# Patient Record
Sex: Female | Born: 1970 | Race: White | Hispanic: No | Marital: Married | Smoking: Never smoker
Health system: Southern US, Community
[De-identification: ages and names within clinical notes are randomized; demographics above are authoritative.]

## PROBLEM LIST (undated history)

## (undated) DIAGNOSIS — J45909 Unspecified asthma, uncomplicated: Secondary | ICD-10-CM

## (undated) DIAGNOSIS — K5900 Constipation, unspecified: Secondary | ICD-10-CM

## (undated) DIAGNOSIS — R112 Nausea with vomiting, unspecified: Secondary | ICD-10-CM

## (undated) DIAGNOSIS — E282 Polycystic ovarian syndrome: Secondary | ICD-10-CM

## (undated) DIAGNOSIS — K219 Gastro-esophageal reflux disease without esophagitis: Secondary | ICD-10-CM

## (undated) DIAGNOSIS — J302 Other seasonal allergic rhinitis: Secondary | ICD-10-CM

## (undated) DIAGNOSIS — D649 Anemia, unspecified: Secondary | ICD-10-CM

## (undated) DIAGNOSIS — J40 Bronchitis, not specified as acute or chronic: Secondary | ICD-10-CM

## (undated) DIAGNOSIS — Z9889 Other specified postprocedural states: Secondary | ICD-10-CM

## (undated) HISTORY — DX: Unspecified asthma, uncomplicated: J45.909

## (undated) HISTORY — DX: Constipation, unspecified: K59.00

## (undated) HISTORY — PX: CHOLECYSTECTOMY: SHX55

## (undated) HISTORY — PX: TUBAL LIGATION: SHX77

## (undated) HISTORY — DX: Polycystic ovarian syndrome: E28.2

## (undated) HISTORY — PX: OTHER SURGICAL HISTORY: SHX169

## (undated) HISTORY — PX: WISDOM TOOTH EXTRACTION: SHX21

## (undated) HISTORY — DX: Anemia, unspecified: D64.9

---

## 1998-10-29 ENCOUNTER — Other Ambulatory Visit: Admission: RE | Admit: 1998-10-29 | Discharge: 1998-10-29 | Payer: Self-pay | Admitting: Obstetrics and Gynecology

## 1999-04-27 ENCOUNTER — Inpatient Hospital Stay (HOSPITAL_COMMUNITY): Admission: AD | Admit: 1999-04-27 | Discharge: 1999-04-27 | Payer: Self-pay | Admitting: Obstetrics and Gynecology

## 1999-04-29 ENCOUNTER — Inpatient Hospital Stay (HOSPITAL_COMMUNITY): Admission: AD | Admit: 1999-04-29 | Discharge: 1999-04-29 | Payer: Self-pay | Admitting: Obstetrics and Gynecology

## 1999-05-03 ENCOUNTER — Inpatient Hospital Stay (HOSPITAL_COMMUNITY): Admission: AD | Admit: 1999-05-03 | Discharge: 1999-05-06 | Payer: Self-pay | Admitting: Obstetrics and Gynecology

## 1999-08-15 ENCOUNTER — Encounter: Admission: RE | Admit: 1999-08-15 | Discharge: 1999-11-13 | Payer: Self-pay | Admitting: Obstetrics and Gynecology

## 2000-02-17 ENCOUNTER — Encounter: Payer: Self-pay | Admitting: Obstetrics and Gynecology

## 2000-02-17 ENCOUNTER — Encounter: Admission: RE | Admit: 2000-02-17 | Discharge: 2000-02-17 | Payer: Self-pay | Admitting: Obstetrics and Gynecology

## 2001-06-21 ENCOUNTER — Other Ambulatory Visit: Admission: RE | Admit: 2001-06-21 | Discharge: 2001-06-21 | Payer: Self-pay | Admitting: Obstetrics and Gynecology

## 2002-10-15 ENCOUNTER — Other Ambulatory Visit: Admission: RE | Admit: 2002-10-15 | Discharge: 2002-10-15 | Payer: Self-pay | Admitting: Obstetrics and Gynecology

## 2003-01-02 ENCOUNTER — Ambulatory Visit (HOSPITAL_COMMUNITY): Admission: RE | Admit: 2003-01-02 | Discharge: 2003-01-02 | Payer: Self-pay | Admitting: Obstetrics and Gynecology

## 2003-03-14 HISTORY — PX: LAPAROSCOPIC ENDOMETRIOSIS FULGURATION: SUR769

## 2005-03-09 ENCOUNTER — Other Ambulatory Visit: Admission: RE | Admit: 2005-03-09 | Discharge: 2005-03-09 | Payer: Self-pay | Admitting: Obstetrics and Gynecology

## 2005-04-01 ENCOUNTER — Inpatient Hospital Stay (HOSPITAL_COMMUNITY): Admission: AD | Admit: 2005-04-01 | Discharge: 2005-04-01 | Payer: Self-pay | Admitting: Obstetrics and Gynecology

## 2005-08-31 ENCOUNTER — Inpatient Hospital Stay (HOSPITAL_COMMUNITY): Admission: AD | Admit: 2005-08-31 | Discharge: 2005-08-31 | Payer: Self-pay | Admitting: Obstetrics and Gynecology

## 2005-09-21 ENCOUNTER — Inpatient Hospital Stay (HOSPITAL_COMMUNITY): Admission: AD | Admit: 2005-09-21 | Discharge: 2005-09-24 | Payer: Self-pay | Admitting: Obstetrics and Gynecology

## 2005-09-25 ENCOUNTER — Encounter: Admission: RE | Admit: 2005-09-25 | Discharge: 2005-10-23 | Payer: Self-pay | Admitting: Obstetrics and Gynecology

## 2005-10-05 ENCOUNTER — Inpatient Hospital Stay (HOSPITAL_COMMUNITY): Admission: AD | Admit: 2005-10-05 | Discharge: 2005-10-05 | Payer: Self-pay | Admitting: Obstetrics and Gynecology

## 2011-12-19 ENCOUNTER — Ambulatory Visit: Payer: Self-pay | Admitting: Obstetrics and Gynecology

## 2012-02-22 ENCOUNTER — Encounter: Payer: Self-pay | Admitting: Obstetrics and Gynecology

## 2012-02-22 ENCOUNTER — Ambulatory Visit (INDEPENDENT_AMBULATORY_CARE_PROVIDER_SITE_OTHER): Payer: BC Managed Care – PPO | Admitting: Obstetrics and Gynecology

## 2012-02-22 VITALS — BP 108/70 | Ht 65.0 in | Wt 224.0 lb

## 2012-02-22 DIAGNOSIS — Z01419 Encounter for gynecological examination (general) (routine) without abnormal findings: Secondary | ICD-10-CM

## 2012-02-22 DIAGNOSIS — Z124 Encounter for screening for malignant neoplasm of cervix: Secondary | ICD-10-CM

## 2012-02-22 NOTE — Progress Notes (Signed)
ANNUAL GYNECOLOGIC EXAMINATION   Courtney Mcintyre is a 41 y.o. female, No obstetric history on file., who presents for an annual exam. The patient has a history of recurrent ASCUS Pap smears. HPV is negative.   History   Social History  . Marital Status: Married    Spouse Name: N/A    Number of Children: N/A  . Years of Education: N/A   Social History Main Topics  . Smoking status: Never Smoker   . Smokeless tobacco: Never Used  . Alcohol Use: No  . Drug Use: No  . Sexually Active: Yes -- Female partner(s)    Birth Control/ Protection: Surgical   Other Topics Concern  . None   Social History Narrative  . None    Menstrual cycle:   LMP: Patient's last menstrual period was 02/08/2012.             The following portions of the patient's history were reviewed and updated as appropriate: allergies, current medications, past family history, past medical history, past social history, past surgical history and problem list.  Review of Systems Pertinent items are noted in HPI. Breast:Negative for breast lump,nipple discharge or nipple retraction Gastrointestinal: Negative for abdominal pain, change in bowel habits or rectal bleeding Urinary:negative   Objective:    BP 108/70  Wt 224 lb (101.606 kg)  LMP 02/08/2012    Weight:  Wt Readings from Last 1 Encounters:  02/22/12 224 lb (101.606 kg)          BMI: There is no height on file to calculate BMI.  General Appearance: Alert, appropriate appearance for age. No acute distress HEENT: Grossly normal Neck / Thyroid: Supple, no masses, nodes or enlargement Lungs: clear to auscultation bilaterally Back: No CVA tenderness Breast Exam: No masses or nodes.No dimpling, nipple retraction or discharge. Cardiovascular: Regular rate and rhythm. S1, S2, no murmur Gastrointestinal: Soft, non-tender, no masses or organomegaly  ++++++++++++++++++++++++++++++++++++++++++++++++++++++++  Pelvic Exam: External genitalia: normal general  appearance Vaginal: normal without tenderness, induration or masses. Relaxation: Yes Cervix: normal appearance Adnexa: normal bimanual exam Uterus: normal size, shape, and consistency Rectovaginal: normal rectal, no masses  ++++++++++++++++++++++++++++++++++++++++++++++++++++++++  Lymphatic Exam: Non-palpable nodes in neck, clavicular, axillary, or inguinal regions Neurologic: Normal speech, no tremor  Psychiatric: Alert and oriented, appropriate affect.  Assessment:    Normal gyn exam   Overweight or obese: Yes   Pelvic relaxation: Yes  History of recurrent ASCUS Pap smear. HPV negative.   Plan:    mammogram pap smear return annually or prn Contraception:bilateral tubal ligation    Medications prescribed: none  STD screen request: No   The updated Pap smear screening guidelines were discussed with the patient. The patient requested that I obtain a Pap smear: Yes.  Kegel exercises discussed: Yes.  I have recommended that we repeat Pap smear only at this point. If the patient wants to try something to help her Pap smear, then we may try Boric Acid suppositories or Vagifem.  Proper diet and regular exercise were reviewed.  Annual mammograms recommended starting at age 70. Proper breast care was discussed.  Screening colonoscopy is recommended beginning at age 29.  Regular health maintenance was reviewed.  Sleep hygiene was discussed.  Adequate calcium and vitamin D intake was emphasized.  Leonard Schwartz M.D.    Regular Periods: yes Mammogram: 01/04/2011 "WNL"  Monthly Breast Ex.: no Exercise: yes "Trying"  Tetanus < 10 years: no Seatbelts: yes  NI. Bladder Functn.: yes Abuse at home: no  Daily  BM's: yes Stressful Work: no  Healthy Diet: yes Sigmoid-Colonoscopy: N/A  Calcium: no Medical problems this year: N/A   LAST PAP:12/13/2010 ASC-US    Contraception: BTL  Mammogram:  2012   PCP: Dr.Gates   PMH: No Changes   FMH: Father had open  heart surgery 2 summers ago.   Last Bone Scan: N/A

## 2012-02-26 LAB — PAP IG W/ RFLX HPV ASCU

## 2012-10-14 ENCOUNTER — Other Ambulatory Visit: Payer: Self-pay | Admitting: Obstetrics and Gynecology

## 2014-07-09 ENCOUNTER — Telehealth: Payer: Self-pay | Admitting: Hematology and Oncology

## 2014-07-09 NOTE — Telephone Encounter (Signed)
new patient appt-s/w patient and gave np appt for 05/20 @ 12:30 w/Dr. Pamelia HoitGudena Referring Dr. Shaune Pollackonna Gates Dx-Leukocytosis

## 2014-07-09 NOTE — Telephone Encounter (Signed)
new patient referral-left message for patient to return call to schedule appt °

## 2014-07-16 ENCOUNTER — Other Ambulatory Visit: Payer: Self-pay | Admitting: Obstetrics and Gynecology

## 2014-07-31 ENCOUNTER — Ambulatory Visit (HOSPITAL_BASED_OUTPATIENT_CLINIC_OR_DEPARTMENT_OTHER): Payer: BLUE CROSS/BLUE SHIELD | Admitting: Hematology and Oncology

## 2014-07-31 ENCOUNTER — Ambulatory Visit: Payer: BLUE CROSS/BLUE SHIELD

## 2014-07-31 ENCOUNTER — Encounter: Payer: Self-pay | Admitting: Hematology and Oncology

## 2014-07-31 ENCOUNTER — Telehealth: Payer: Self-pay | Admitting: Hematology and Oncology

## 2014-07-31 VITALS — BP 115/55 | HR 69 | Temp 97.1°F | Resp 18 | Ht 65.0 in | Wt 215.8 lb

## 2014-07-31 DIAGNOSIS — D72829 Elevated white blood cell count, unspecified: Secondary | ICD-10-CM

## 2014-07-31 DIAGNOSIS — N920 Excessive and frequent menstruation with regular cycle: Secondary | ICD-10-CM | POA: Diagnosis not present

## 2014-07-31 LAB — CBC WITH DIFFERENTIAL/PLATELET
BASO%: 0.9 % (ref 0.0–2.0)
BASOS ABS: 0.1 10*3/uL (ref 0.0–0.1)
EOS%: 2.9 % (ref 0.0–7.0)
Eosinophils Absolute: 0.4 10*3/uL (ref 0.0–0.5)
HEMATOCRIT: 38 % (ref 34.8–46.6)
HGB: 12 g/dL (ref 11.6–15.9)
LYMPH%: 20.8 % (ref 14.0–49.7)
MCH: 24.4 pg — AB (ref 25.1–34.0)
MCHC: 31.5 g/dL (ref 31.5–36.0)
MCV: 77.4 fL — AB (ref 79.5–101.0)
MONO#: 0.8 10*3/uL (ref 0.1–0.9)
MONO%: 6.2 % (ref 0.0–14.0)
NEUT#: 9.2 10*3/uL — ABNORMAL HIGH (ref 1.5–6.5)
NEUT%: 69.2 % (ref 38.4–76.8)
Platelets: 353 10*3/uL (ref 145–400)
RBC: 4.92 10*6/uL (ref 3.70–5.45)
RDW: 24 % — ABNORMAL HIGH (ref 11.2–14.5)
WBC: 13.3 10*3/uL — ABNORMAL HIGH (ref 3.9–10.3)
lymph#: 2.8 10*3/uL (ref 0.9–3.3)

## 2014-07-31 LAB — C-REACTIVE PROTEIN: CRP: 1.2 mg/dL — ABNORMAL HIGH (ref <0.60)

## 2014-07-31 NOTE — Progress Notes (Signed)
[facility] NOTE  Patient Care Team: Darcus Austin, MD as PCP - General (Family Medicine)  CHIEF COMPLAINTS/PURPOSE OF CONSULTATION:  Leukocytosis  HISTORY OF PRESENTING ILLNESS:  Courtney Mcintyre 44 y.o. female is here because of chronic leukocytosis. Patient recently has been undergoing a lot of problems with heavy menstrual bleeding along with heavy intermenstrual bleeding. She was diagnosed with endometriosis and thickening of the lining of the uterus. She is scheduled to undergo hysterectomy 08/18/2014. She was noted on a regular CBC to have elevated white blood cell count of 13,000 and 12,000 respectively in March and April. According to her she apparently had elevated white blood cell count even a year ago and this led to the concern for hematology evaluation. Patient is otherwise healthy without any other chronic medical problems. She does have history of polycystic ovarian disease.  I reviewed her records extensively and collaborated the history with the patient.  MEDICAL HISTORY:  Past Medical History  Diagnosis Date  . Anemia     SURGICAL HISTORY: Past Surgical History  Procedure Laterality Date  . Tubal ligation    . Cholecystectomy    . Cesarean section  2001, 2007  . Laparoscopic endometriosis fulguration  2005  . Cystoscopy      SOCIAL HISTORY: History   Social History  . Marital Status: Married    Spouse Name: N/A  . Number of Children: N/A  . Years of Education: N/A   Occupational History  . Not on file.   Social History Main Topics  . Smoking status: Never Smoker   . Smokeless tobacco: Never Used  . Alcohol Use: No  . Drug Use: No  . Sexual Activity:    Partners: Male    Birth Control/ Protection: Surgical   Other Topics Concern  . Not on file   Social History Narrative    FAMILY HISTORY: Family History  Problem Relation Age of Onset  . Heart disease Father   . Hypercholesterolemia Father   . Endometriosis Sister    . Endometriosis Sister     ALLERGIES:  is allergic to codeine; compazine; and zofran.  MEDICATIONS:  Current Outpatient Prescriptions  Medication Sig Dispense Refill  . cetirizine (ZYRTEC) 10 MG tablet Take 10 mg by mouth daily.    . fluticasone (FLONASE) 50 MCG/ACT nasal spray Place 1 spray into both nostrils daily.    Marland Kitchen ibuprofen (ADVIL,MOTRIN) 800 MG tablet 800 mg 3 (three) times daily.     Lenda Kelp FE 1/20 1-20 MG-MCG tablet Take 1 tablet by mouth daily.   0  . medroxyPROGESTERone (PROVERA) 10 MG tablet   1  . montelukast (SINGULAIR) 10 MG tablet     . PRESCRIPTION MEDICATION Take 2 tablets by mouth 2 (two) times daily.    . ranitidine (ZANTAC) 150 MG tablet Take 150 mg by mouth 2 (two) times daily.    . vitamin B-12 (CYANOCOBALAMIN) 100 MCG tablet Take 100 mcg by mouth daily.     No current facility-administered medications for this visit.    REVIEW OF SYSTEMS:   Constitutional: Denies fevers, chills or abnormal night sweats Eyes: Denies blurriness of vision, double vision or watery eyes Ears, nose, mouth, throat, and face: Denies mucositis or sore throat Respiratory: Denies cough, dyspnea or wheezes Cardiovascular: Denies palpitation, chest discomfort or lower extremity swelling Gastrointestinal:  Denies nausea, heartburn or change in bowel habits Skin: Denies abnormal skin rashes Lymphatics: Denies new lymphadenopathy or easy bruising Neurological:Denies numbness, tingling or new weaknesses Behavioral/Psych:  Mood is stable, no new changes  GYN: heavy menstrual bleeding and intermenstrual bleeding due to endometriosis and endometrial hyperplasia All other systems were reviewed with the patient and are negative.  PHYSICAL EXAMINATION: ECOG PERFORMANCE STATUS: 1 - Symptomatic but completely ambulatory  Filed Vitals:   07/31/14 1224  BP: 115/55  Pulse: 69  Temp: 97.1 F (36.2 C)  Resp: 18   Filed Weights   07/31/14 1224  Weight: 215 lb 12.8 oz (97.886 kg)     GENERAL:alert, no distress and comfortable SKIN: skin color, texture, turgor are normal, no rashes or significant lesions EYES: normal, conjunctiva are pink and non-injected, sclera clear OROPHARYNX:no exudate, no erythema and lips, buccal mucosa, and tongue normal  NECK: supple, thyroid normal size, non-tender, without nodularity LYMPH:  no palpable lymphadenopathy in the cervical, axillary or inguinal LUNGS: clear to auscultation and percussion with normal breathing effort HEART: regular rate & rhythm and no murmurs and no lower extremity edema ABDOMEN:abdomen soft, non-tender and normal bowel sounds Musculoskeletal:no cyanosis of digits and no clubbing  PSYCH: alert & oriented x 3 with fluent speech NEURO: no focal motor/sensory deficits  LABORATORY DATA:  I have reviewed the data as listed Lab Results  Component Value Date   WBC 13.3* 07/31/2014   HGB 12.0 07/31/2014   HCT 38.0 07/31/2014   MCV 77.4* 07/31/2014   PLT 353 07/31/2014   No results found for: NA, K, CL, CO2   ASSESSMENT AND PLAN:  1. Leukocytosis:  I discussed with the patient entire pathophysiology of white blood cells including the differential of white blood cells. I discussed with her that the first step in evaluating her would be to obtain a differential on the white blood cells to find out if the elevation of the white blood cells is related to neutrophils and lymphocytes and eosinophils are monocytes or basophils.   2. I would also like to test for C-reactive protein to evaluate if she has underlying inflammation.  (3. I discussed the entire differential diagnosis depends on the type of blood cell abnormality. If it is neutrophils then we would consider infections and formations as the most likely etiologies. If it is lymphocytosis then certainly monoclonal B-cell lymphocytosis/CLL/lymphoma could be in the differential. If it is eosinophils then allergy conditions including drug hypersensitivity could be  in the differential, if it is basophils than CML could be in the differential.)  Result review: 1.  CBC with Diff revealed Leukocytosis is being caused by increased Neutrophills. I strongly believe it is related to underlying chronic inflammation (either from uterus or sinuses or asthma) 2. We do not need to do a bone marrow biopsy 3. Follow up in 3 months with another CBCdiff  Menorrhagia: Getting Hysterectomy next month. Thrombocytosis: Platelets normalized. They are an acute phase reactant secondary to iron def or inflammation.  All questions were answered. The patient knows to call the clinic with any problems, questions or concerns.    Rulon Eisenmenger, MD 2:29 PM

## 2014-07-31 NOTE — Progress Notes (Signed)
Checked in new pt with no financial concerns. °

## 2014-07-31 NOTE — Telephone Encounter (Signed)
Gave avs & calendar for August °

## 2014-08-13 NOTE — Patient Instructions (Addendum)
   Your procedure is scheduled on:  Thursday, June 9  [facility] at: 10:30 AM Pick up the phone at the desk and dial [phone] and inform us of your arrival.  Please call this number if you have any problems the morning of surgery: [phone]  Remember: Do not eat food after midnight: Wednesday Do not drink clear liquids after: 8 AM Thursday, Day of Surgery Take these medicines the morning of surgery with a SIP OF WATER: NONE  Do not wear jewelry, make-up, or FINGER nail polish No metal in your hair or on your body. Do not wear lotions, powders, perfumes.  You may wear deodorant.  Do not bring valuables to the hospital.  Leave suitcase in the car. After Surgery it may be brought to your room. For patients being admitted to the hospital, checkout time is 11:00am the day of discharge.  Home with husband Kathlene NovemberMike cell [phone]

## 2014-08-14 ENCOUNTER — Encounter (HOSPITAL_COMMUNITY)
Admission: RE | Admit: 2014-08-14 | Discharge: 2014-08-14 | Disposition: A | Discharge status: Home / Self Care | Payer: BLUE CROSS/BLUE SHIELD | Source: Ambulatory Visit | Attending: Obstetrics and Gynecology | Admitting: Obstetrics and Gynecology

## 2014-08-14 ENCOUNTER — Encounter (HOSPITAL_COMMUNITY): Payer: Self-pay

## 2014-08-14 DIAGNOSIS — Z01818 Encounter for other preprocedural examination: Secondary | ICD-10-CM | POA: Diagnosis not present

## 2014-08-14 HISTORY — DX: Other seasonal allergic rhinitis: J30.2

## 2014-08-14 HISTORY — DX: Nausea with vomiting, unspecified: R11.2

## 2014-08-14 HISTORY — DX: Bronchitis, not specified as acute or chronic: J40

## 2014-08-14 HISTORY — DX: Gastro-esophageal reflux disease without esophagitis: K21.9

## 2014-08-14 HISTORY — DX: Other specified postprocedural states: Z98.890

## 2014-08-14 NOTE — Pre-Procedure Instructions (Signed)
No labs drawn at PAT appointment because [facility].  Lab results placed on chart

## 2014-08-19 NOTE — H&P (Signed)
Admission History and Physical Exam for a Gynecology Patient  Ms. Courtney Mcintyre is a 44 y.o. female who presents for a laparoscopy assisted vaginal hysterectomy and bilateral laparoscopic salpingectomy. [facility]. She complains of menorrhagia and anemia. She is currently taking multidose oral contraception is to control her bleeding. She is taking iron. The patient has had laparoscopy in the past as well as cesarean deliveries. She has had a tubal sterilization procedure. She was noted to have adhesions between her left ovary and the left posterior uterus. Her most recent Pap smear is within normal limits. She has had a negative endometrial biopsy in the past.  OB History    No data available      Past Medical History  Diagnosis Date  . Anemia   . PONV (postoperative nausea and vomiting)   . Seasonal allergies   . Bronchitis   . GERD (gastroesophageal reflux disease)     No prescriptions prior to admission    Past Surgical History  Procedure Laterality Date  . Tubal ligation    . Cholecystectomy    . Cesarean section  2001, 2007  . Laparoscopic endometriosis fulguration  2005  . Cystoscopy    . Wisdom tooth extraction      Allergies  Allergen Reactions  . Codeine Nausea Only and Other (See Comments)    Chest pain  . Compazine [Prochlorperazine Edisylate] Nausea And Vomiting and Other (See Comments)    Hallucinations   . Zofran [Ondansetron Hcl] Nausea And Vomiting and Other (See Comments)    hallucinations  . Morphine And Related Itching and Nausea And Vomiting    Family History: family history includes Endometriosis in her sister and sister; Heart disease in her father; Hypercholesterolemia in her father.  Social History:  reports that she has never smoked. She has never used smokeless tobacco. She reports that she drinks alcohol. She reports that she does not  use illicit drugs.  Review of systems: See HPI.  Admission Physical Exam:    BMI equals 36.4.  HEENT:                 Within normal limits Chest:                   Clear Heart:                    Regular rate and rhythm Breasts:                No masses, skin changes, bleeding, or discharge present Abdomen:             Nontender, no masses Extremities:          Grossly normal Neurologic exam: Grossly normal  Pelvic exam:  External genitalia: normal general appearance Vaginal: normal without tenderness, induration or masses and Cystocele Cervix: normal appearance Adnexa: normal bimanual exam Uterus: Normal size shape and consistency Rectal: no masses   CBC    Component Value Date/Time   WBC 13.3* 07/31/2014 1359   RBC 4.92 07/31/2014 1359   HGB 12.0 07/31/2014 1359   HCT 38.0 07/31/2014 1359   PLT 353 07/31/2014 1359   MCV 77.4* 07/31/2014 1359   MCH 24.4* 07/31/2014 1359   MCHC 31.5 07/31/2014 1359   RDW 24.0* 07/31/2014 1359   LYMPHSABS 2.8 07/31/2014 1359   MONOABS 0.8 07/31/2014 1359   EOSABS 0.4 07/31/2014 1359  BASOSABS 0.1 07/31/2014 1359      Assessment:  Menorrhagia  History of anemia  History of pelvic surgery  History of anemia  Obesity  Gastroesophageal reflux disease  Plan:  The patient will undergo a laparoscopy assisted vaginal hysterectomy, and bilateral laparoscopic salpingectomy. She understands the indications for surgical procedure. She accepts the risks of, but not limited to, and anesthetic complications, bleeding, infections, and possible damage to the surrounding organs.   Janine Limbo 08/19/2014

## 2014-08-20 ENCOUNTER — Encounter (HOSPITAL_COMMUNITY)
Admission: RE | Disposition: A | Discharge status: Home / Self Care | Payer: Self-pay | Source: Ambulatory Visit | Attending: Obstetrics and Gynecology

## 2014-08-20 ENCOUNTER — Ambulatory Visit (HOSPITAL_COMMUNITY)
Admission: RE | Admit: 2014-08-20 | Discharge: 2014-08-21 | Disposition: A | Discharge status: Home / Self Care | Payer: BLUE CROSS/BLUE SHIELD | Source: Ambulatory Visit | Attending: Obstetrics and Gynecology | Admitting: Obstetrics and Gynecology

## 2014-08-20 ENCOUNTER — Encounter (HOSPITAL_COMMUNITY): Payer: Self-pay | Admitting: Anesthesiology

## 2014-08-20 ENCOUNTER — Ambulatory Visit (HOSPITAL_COMMUNITY): Payer: BLUE CROSS/BLUE SHIELD | Admitting: Anesthesiology

## 2014-08-20 DIAGNOSIS — D649 Anemia, unspecified: Secondary | ICD-10-CM | POA: Diagnosis not present

## 2014-08-20 DIAGNOSIS — Z6836 Body mass index (BMI) 36.0-36.9, adult: Secondary | ICD-10-CM | POA: Diagnosis not present

## 2014-08-20 DIAGNOSIS — K219 Gastro-esophageal reflux disease without esophagitis: Secondary | ICD-10-CM | POA: Diagnosis not present

## 2014-08-20 DIAGNOSIS — R102 Pelvic and perineal pain: Secondary | ICD-10-CM | POA: Insufficient documentation

## 2014-08-20 DIAGNOSIS — Z888 Allergy status to other drugs, medicaments and biological substances status: Secondary | ICD-10-CM | POA: Diagnosis not present

## 2014-08-20 DIAGNOSIS — N920 Excessive and frequent menstruation with regular cycle: Secondary | ICD-10-CM | POA: Diagnosis present

## 2014-08-20 DIAGNOSIS — Z885 Allergy status to narcotic agent status: Secondary | ICD-10-CM | POA: Insufficient documentation

## 2014-08-20 DIAGNOSIS — E669 Obesity, unspecified: Secondary | ICD-10-CM | POA: Insufficient documentation

## 2014-08-20 DIAGNOSIS — N800 Endometriosis of uterus: Secondary | ICD-10-CM | POA: Insufficient documentation

## 2014-08-20 HISTORY — PX: LAPAROSCOPIC BILATERAL SALPINGECTOMY: SHX5889

## 2014-08-20 HISTORY — PX: LAPAROSCOPIC ASSISTED VAGINAL HYSTERECTOMY: SHX5398

## 2014-08-20 HISTORY — PX: CYSTOSCOPY: SHX5120

## 2014-08-20 HISTORY — PX: LAPAROSCOPIC LYSIS OF ADHESIONS: SHX5905

## 2014-08-20 LAB — CBC
HEMATOCRIT: 34 % — AB (ref 36.0–46.0)
Hemoglobin: 11.1 g/dL — ABNORMAL LOW (ref 12.0–15.0)
MCH: 25.7 pg — ABNORMAL LOW (ref 26.0–34.0)
MCHC: 32.6 g/dL (ref 30.0–36.0)
MCV: 78.7 fL (ref 78.0–100.0)
PLATELETS: 341 10*3/uL (ref 150–400)
RBC: 4.32 MIL/uL (ref 3.87–5.11)
RDW: 17 % — ABNORMAL HIGH (ref 11.5–15.5)
WBC: 27 10*3/uL — ABNORMAL HIGH (ref 4.0–10.5)

## 2014-08-20 LAB — TYPE AND SCREEN
ABO/RH(D): O POS
Antibody Screen: NEGATIVE

## 2014-08-20 LAB — PREGNANCY, URINE: Preg Test, Ur: NEGATIVE

## 2014-08-20 LAB — ABO/RH: ABO/RH(D): O POS

## 2014-08-20 SURGERY — HYSTERECTOMY, VAGINAL, LAPAROSCOPY-ASSISTED
Anesthesia: General | Site: Urethra

## 2014-08-20 MED ORDER — NEOSTIGMINE METHYLSULFATE 10 MG/10ML IV SOLN
INTRAVENOUS | Status: DC | PRN
  Administered 2014-08-20: 2 mg via INTRAVENOUS

## 2014-08-20 MED ORDER — BUPIVACAINE HCL (PF) 0.25 % IJ SOLN
INTRAMUSCULAR | Status: AC
  Filled 2014-08-20: qty 20

## 2014-08-20 MED ORDER — HYDROMORPHONE HCL 1 MG/ML IJ SOLN
INTRAMUSCULAR | Status: AC
  Filled 2014-08-20: qty 1

## 2014-08-20 MED ORDER — MEPERIDINE HCL 25 MG/ML IJ SOLN: 6.2500 mg | INTRAMUSCULAR | Status: DC | PRN

## 2014-08-20 MED ORDER — GLYCOPYRROLATE 0.2 MG/ML IJ SOLN
INTRAMUSCULAR | Status: DC | PRN
  Administered 2014-08-20: 0.4 mg via INTRAVENOUS
  Administered 2014-08-20: 0.1 mg via INTRAVENOUS

## 2014-08-20 MED ORDER — DEXAMETHASONE SODIUM PHOSPHATE 10 MG/ML IJ SOLN
INTRAMUSCULAR | Status: DC | PRN
  Administered 2014-08-20: 4 mg via INTRAVENOUS

## 2014-08-20 MED ORDER — MIDAZOLAM HCL 2 MG/2ML IJ SOLN
INTRAMUSCULAR | Status: DC | PRN
  Administered 2014-08-20: 2 mg via INTRAVENOUS

## 2014-08-20 MED ORDER — IBUPROFEN 800 MG PO TABS: 800.0000 mg | ORAL_TABLET | Freq: Three times a day (TID) | ORAL | Status: DC | PRN

## 2014-08-20 MED ORDER — FENTANYL CITRATE (PF) 250 MCG/5ML IJ SOLN
INTRAMUSCULAR | Status: AC
  Filled 2014-08-20: qty 5

## 2014-08-20 MED ORDER — LORATADINE 10 MG PO TABS
10.0000 mg | ORAL_TABLET | Freq: Every day | ORAL | Status: DC
  Administered 2014-08-21: 10 mg via ORAL
  Filled 2014-08-20 (×3): qty 1

## 2014-08-20 MED ORDER — GLYCOPYRROLATE 0.2 MG/ML IJ SOLN
INTRAMUSCULAR | Status: AC
Start: 2014-08-20 — End: 2014-08-20
  Filled 2014-08-20: qty 2

## 2014-08-20 MED ORDER — PROMETHAZINE HCL 25 MG/ML IJ SOLN
6.2500 mg | INTRAMUSCULAR | Status: DC | PRN
Start: 2014-08-20 — End: 2014-08-20
  Administered 2014-08-20: 6.25 mg via INTRAVENOUS

## 2014-08-20 MED ORDER — CEFAZOLIN SODIUM-DEXTROSE 2-3 GM-% IV SOLR
2.0000 g | INTRAVENOUS | Status: AC
  Administered 2014-08-20 (×2): 2 g via INTRAVENOUS

## 2014-08-20 MED ORDER — KETOROLAC TROMETHAMINE 30 MG/ML IJ SOLN
30.0000 mg | Freq: Four times a day (QID) | INTRAMUSCULAR | Status: AC
  Administered 2014-08-20 – 2014-08-21 (×4): 30 mg via INTRAVENOUS
  Filled 2014-08-20 (×4): qty 1

## 2014-08-20 MED ORDER — BUPIVACAINE-EPINEPHRINE (PF) 0.5% -1:200000 IJ SOLN
INTRAMUSCULAR | Status: AC
  Filled 2014-08-20: qty 30

## 2014-08-20 MED ORDER — FLUTICASONE PROPIONATE 50 MCG/ACT NA SUSP
1.0000 | Freq: Every evening | NASAL | Status: DC | PRN
  Filled 2014-08-20: qty 16

## 2014-08-20 MED ORDER — PROPOFOL 10 MG/ML IV BOLUS
INTRAVENOUS | Status: AC
  Filled 2014-08-20: qty 20

## 2014-08-20 MED ORDER — SODIUM CHLORIDE 0.9 % IJ SOLN
INTRAMUSCULAR | Status: AC
  Filled 2014-08-20: qty 3

## 2014-08-20 MED ORDER — ROCURONIUM BROMIDE 100 MG/10ML IV SOLN
INTRAVENOUS | Status: AC
  Filled 2014-08-20: qty 1

## 2014-08-20 MED ORDER — FENTANYL CITRATE (PF) 100 MCG/2ML IJ SOLN
INTRAMUSCULAR | Status: AC
  Filled 2014-08-20: qty 2

## 2014-08-20 MED ORDER — ENOXAPARIN SODIUM 40 MG/0.4ML ~~LOC~~ SOLN
40.0000 mg | SUBCUTANEOUS | Status: DC
  Administered 2014-08-21: 40 mg via SUBCUTANEOUS
  Filled 2014-08-20 (×2): qty 0.4

## 2014-08-20 MED ORDER — PROMETHAZINE HCL 25 MG/ML IJ SOLN
12.5000 mg | Freq: Four times a day (QID) | INTRAMUSCULAR | Status: DC | PRN
  Administered 2014-08-21: 12.5 mg via INTRAVENOUS
  Filled 2014-08-20: qty 1

## 2014-08-20 MED ORDER — MIDAZOLAM HCL 2 MG/2ML IJ SOLN
INTRAMUSCULAR | Status: AC
  Filled 2014-08-20: qty 2

## 2014-08-20 MED ORDER — FAMOTIDINE IN NACL 20-0.9 MG/50ML-% IV SOLN
20.0000 mg | Freq: Once | INTRAVENOUS | Status: AC
  Administered 2014-08-20: 20 mg via INTRAVENOUS

## 2014-08-20 MED ORDER — PROMETHAZINE HCL 25 MG/ML IJ SOLN
INTRAMUSCULAR | Status: AC
  Filled 2014-08-20: qty 1

## 2014-08-20 MED ORDER — LIDOCAINE HCL (CARDIAC) 20 MG/ML IV SOLN
INTRAVENOUS | Status: AC
  Filled 2014-08-20: qty 5

## 2014-08-20 MED ORDER — CEFAZOLIN SODIUM-DEXTROSE 2-3 GM-% IV SOLR
INTRAVENOUS | Status: AC
  Filled 2014-08-20: qty 50

## 2014-08-20 MED ORDER — BUPIVACAINE LIPOSOME 1.3 % IJ SUSP
20.0000 mL | Freq: Once | INTRAMUSCULAR | Status: DC
  Filled 2014-08-20: qty 20

## 2014-08-20 MED ORDER — SIMETHICONE 80 MG PO CHEW
80.0000 mg | CHEWABLE_TABLET | Freq: Four times a day (QID) | ORAL | Status: DC | PRN
  Administered 2014-08-21 (×2): 80 mg via ORAL
  Filled 2014-08-20 (×2): qty 1

## 2014-08-20 MED ORDER — ROCURONIUM BROMIDE 100 MG/10ML IV SOLN
INTRAVENOUS | Status: DC | PRN
  Administered 2014-08-20: 5 mg via INTRAVENOUS
  Administered 2014-08-20: 10 mg via INTRAVENOUS
  Administered 2014-08-20: 35 mg via INTRAVENOUS
  Administered 2014-08-20: 10 mg via INTRAVENOUS

## 2014-08-20 MED ORDER — LACTATED RINGERS IV SOLN
INTRAVENOUS | Status: DC
  Administered 2014-08-20 (×5): via INTRAVENOUS

## 2014-08-20 MED ORDER — SCOPOLAMINE 1 MG/3DAYS TD PT72
1.0000 | MEDICATED_PATCH | Freq: Once | TRANSDERMAL | Status: DC
  Administered 2014-08-20: 1.5 mg via TRANSDERMAL

## 2014-08-20 MED ORDER — KETOROLAC TROMETHAMINE 30 MG/ML IJ SOLN
30.0000 mg | Freq: Once | INTRAMUSCULAR | Status: AC | PRN
  Administered 2014-08-20: 30 mg via INTRAVENOUS

## 2014-08-20 MED ORDER — LIDOCAINE HCL (CARDIAC) 20 MG/ML IV SOLN
INTRAVENOUS | Status: DC | PRN
  Administered 2014-08-20: 80 mg via INTRAVENOUS

## 2014-08-20 MED ORDER — FAMOTIDINE IN NACL 20-0.9 MG/50ML-% IV SOLN
INTRAVENOUS | Status: AC
  Filled 2014-08-20: qty 50

## 2014-08-20 MED ORDER — LACTATED RINGERS IR SOLN
Status: DC | PRN
  Administered 2014-08-20: 3000 mL

## 2014-08-20 MED ORDER — BUPIVACAINE-EPINEPHRINE 0.5% -1:200000 IJ SOLN
INTRAMUSCULAR | Status: DC | PRN
  Administered 2014-08-20: 36 mL

## 2014-08-20 MED ORDER — PROMETHAZINE HCL 25 MG/ML IJ SOLN
INTRAMUSCULAR | Status: DC | PRN
  Administered 2014-08-20 (×2): 12.5 mg via INTRAVENOUS

## 2014-08-20 MED ORDER — OXYCODONE-ACETAMINOPHEN 5-325 MG PO TABS
1.0000 | ORAL_TABLET | ORAL | Status: DC | PRN
  Administered 2014-08-21 (×3): 1 via ORAL
  Filled 2014-08-20 (×3): qty 1

## 2014-08-20 MED ORDER — DEXTROSE IN LACTATED RINGERS 5 % IV SOLN
INTRAVENOUS | Status: DC
  Administered 2014-08-20 – 2014-08-21 (×4): via INTRAVENOUS

## 2014-08-20 MED ORDER — PHENYLEPHRINE HCL 10 MG/ML IJ SOLN
INTRAMUSCULAR | Status: DC | PRN
  Administered 2014-08-20: 80 ug via INTRAVENOUS

## 2014-08-20 MED ORDER — METHYLENE BLUE 1 % INJ SOLN
INTRAMUSCULAR | Status: DC | PRN
  Administered 2014-08-20: 10 mg via INTRAVENOUS

## 2014-08-20 MED ORDER — SODIUM CHLORIDE 0.9 % IJ SOLN
INTRAMUSCULAR | Status: AC
  Filled 2014-08-20: qty 100

## 2014-08-20 MED ORDER — VASOPRESSIN 20 UNIT/ML IV SOLN
INTRAVENOUS | Status: AC
Start: 2014-08-20 — End: 2014-08-20
  Filled 2014-08-20: qty 1

## 2014-08-20 MED ORDER — HEPARIN SODIUM (PORCINE) 5000 UNIT/ML IJ SOLN
INTRAMUSCULAR | Status: AC
Start: 2014-08-20 — End: 2014-08-20
  Filled 2014-08-20: qty 1

## 2014-08-20 MED ORDER — KETOROLAC TROMETHAMINE 30 MG/ML IJ SOLN: 30.0000 mg | Freq: Four times a day (QID) | INTRAMUSCULAR | Status: AC

## 2014-08-20 MED ORDER — DEXAMETHASONE SODIUM PHOSPHATE 4 MG/ML IJ SOLN
INTRAMUSCULAR | Status: AC
  Filled 2014-08-20: qty 1

## 2014-08-20 MED ORDER — FAMOTIDINE 20 MG PO TABS
20.0000 mg | ORAL_TABLET | Freq: Every day | ORAL | Status: DC
  Administered 2014-08-21: 20 mg via ORAL
  Filled 2014-08-20: qty 1

## 2014-08-20 MED ORDER — MONTELUKAST SODIUM 10 MG PO TABS
10.0000 mg | ORAL_TABLET | Freq: Every day | ORAL | Status: DC
  Administered 2014-08-20: 10 mg via ORAL
  Filled 2014-08-20 (×3): qty 1

## 2014-08-20 MED ORDER — FENTANYL CITRATE (PF) 100 MCG/2ML IJ SOLN
INTRAMUSCULAR | Status: DC | PRN
Start: 2014-08-20 — End: 2014-08-20
  Administered 2014-08-20 (×2): 50 ug via INTRAVENOUS
  Administered 2014-08-20 (×2): 100 ug via INTRAVENOUS
  Administered 2014-08-20: 50 ug via INTRAVENOUS

## 2014-08-20 MED ORDER — METHYLENE BLUE 1 % INJ SOLN
INTRAMUSCULAR | Status: AC
  Filled 2014-08-20: qty 1

## 2014-08-20 MED ORDER — SCOPOLAMINE 1 MG/3DAYS TD PT72
MEDICATED_PATCH | TRANSDERMAL | Status: AC
  Administered 2014-08-20: 1.5 mg via TRANSDERMAL
  Filled 2014-08-20: qty 1

## 2014-08-20 MED ORDER — STERILE WATER FOR IRRIGATION IR SOLN
Status: DC | PRN
  Administered 2014-08-20: 1000 mL

## 2014-08-20 MED ORDER — ONDANSETRON HCL 4 MG/2ML IJ SOLN
INTRAMUSCULAR | Status: AC
  Filled 2014-08-20: qty 2

## 2014-08-20 MED ORDER — HYDROMORPHONE HCL 1 MG/ML IJ SOLN
INTRAMUSCULAR | Status: DC | PRN
  Administered 2014-08-20 (×2): 1 mg via INTRAVENOUS

## 2014-08-20 MED ORDER — PROMETHAZINE HCL 25 MG/ML IJ SOLN
INTRAMUSCULAR | Status: AC
  Administered 2014-08-20: 6.25 mg via INTRAVENOUS
  Filled 2014-08-20: qty 1

## 2014-08-20 MED ORDER — FENTANYL CITRATE (PF) 100 MCG/2ML IJ SOLN: 25.0000 ug | INTRAMUSCULAR | Status: DC | PRN

## 2014-08-20 MED ORDER — PROPOFOL 10 MG/ML IV BOLUS
INTRAVENOUS | Status: DC | PRN
  Administered 2014-08-20: 200 mg via INTRAVENOUS

## 2014-08-20 SURGICAL SUPPLY — 58 items
BAG SPEC RTRVL LRG 6X4 10 (ENDOMECHANICALS)
CABLE HIGH FREQUENCY MONO STRZ (ELECTRODE) ×1 IMPLANT
CATH ROBINSON RED A/P 16FR (CATHETERS) ×1 IMPLANT
CHLORAPREP W/TINT 26ML (MISCELLANEOUS) ×5 IMPLANT
CLOTH BEACON ORANGE TIMEOUT ST (SAFETY) ×5 IMPLANT
CONT PATH 16OZ SNAP LID 3702 (MISCELLANEOUS) ×5 IMPLANT
COVER BACK TABLE 60X90IN (DRAPES) ×5 IMPLANT
DECANTER SPIKE VIAL GLASS SM (MISCELLANEOUS) ×1 IMPLANT
DRAPE SHEET LG 3/4 BI-LAMINATE (DRAPES) ×10 IMPLANT
DRSG COVADERM PLUS 2X2 (GAUZE/BANDAGES/DRESSINGS) ×10 IMPLANT
DRSG OPSITE POSTOP 3X4 (GAUZE/BANDAGES/DRESSINGS) ×1 IMPLANT
DURAPREP 26ML APPLICATOR (WOUND CARE) ×5 IMPLANT
ELECT REM PT RETURN 9FT ADLT (ELECTROSURGICAL) ×5
ELECTRODE REM PT RTRN 9FT ADLT (ELECTROSURGICAL) ×4 IMPLANT
FORCEPS CUTTING 33CM 5MM (CUTTING FORCEPS) IMPLANT
FORCEPS CUTTING 45CM 5MM (CUTTING FORCEPS) IMPLANT
GAUZE SPONGE 4X4 16PLY XRAY LF (GAUZE/BANDAGES/DRESSINGS) ×5 IMPLANT
GAUZE VASELINE 3X9 (GAUZE/BANDAGES/DRESSINGS) IMPLANT
GLOVE BIOGEL PI IND STRL 6.5 (GLOVE) IMPLANT
GLOVE BIOGEL PI IND STRL 7.0 (GLOVE) ×8 IMPLANT
GLOVE BIOGEL PI IND STRL 8.5 (GLOVE) ×4 IMPLANT
GLOVE BIOGEL PI INDICATOR 6.5 (GLOVE) ×4
GLOVE BIOGEL PI INDICATOR 7.0 (GLOVE) ×2
GLOVE BIOGEL PI INDICATOR 8.5 (GLOVE) ×1
GLOVE ECLIPSE 8.0 STRL XLNG CF (GLOVE) ×20 IMPLANT
GOWN STRL REUS W/TWL LRG LVL3 (GOWN DISPOSABLE) ×10 IMPLANT
LIQUID BAND (GAUZE/BANDAGES/DRESSINGS) ×1 IMPLANT
NEEDLE MAYO .5 CIRCLE (NEEDLE) ×5 IMPLANT
NS IRRIG 1000ML POUR BTL (IV SOLUTION) ×5 IMPLANT
PACK LAPAROSCOPY BASIN (CUSTOM PROCEDURE TRAY) ×5 IMPLANT
PACK LAVH (CUSTOM PROCEDURE TRAY) ×5 IMPLANT
PACK ROBOTIC GOWN (GOWN DISPOSABLE) ×5 IMPLANT
PAD POSITIONER PINK NONSTERILE (MISCELLANEOUS) ×5 IMPLANT
POUCH SPECIMEN RETRIEVAL 10MM (ENDOMECHANICALS) IMPLANT
PROTECTOR NERVE ULNAR (MISCELLANEOUS) ×5 IMPLANT
RINGERS IRRIG 1000ML POUR BTL (IV SOLUTION) IMPLANT
SET IRRIG TUBING LAPAROSCOPIC (IRRIGATION / IRRIGATOR) ×1 IMPLANT
SHEARS HARMONIC ACE PLUS 36CM (ENDOMECHANICALS) IMPLANT
SOLUTION ELECTROLUBE (MISCELLANEOUS) ×5 IMPLANT
STRIP CLOSURE SKIN 1/4X3 (GAUZE/BANDAGES/DRESSINGS) ×5 IMPLANT
SUT CHROMIC 1 CT1 27 (SUTURE) IMPLANT
SUT MNCRL AB 3-0 PS2 27 (SUTURE) ×5 IMPLANT
SUT VIC AB 0 CT1 18XCR BRD8 (SUTURE) ×12 IMPLANT
SUT VIC AB 0 CT1 27 (SUTURE) ×20
SUT VIC AB 0 CT1 27XBRD ANBCTR (SUTURE) ×12 IMPLANT
SUT VIC AB 0 CT1 27XCR 8 STRN (SUTURE) IMPLANT
SUT VIC AB 0 CT1 8-18 (SUTURE) ×15
SUT VIC AB 2-0 SH 27 (SUTURE) ×5
SUT VIC AB 2-0 SH 27XBRD (SUTURE) ×4 IMPLANT
SUT VIC AB 2-0 UR6 27 (SUTURE) IMPLANT
SUT VICRYL 0 ENDOLOOP (SUTURE) IMPLANT
SUT VICRYL 0 TIES 12 18 (SUTURE) ×5 IMPLANT
SUT VICRYL 0 UR6 27IN ABS (SUTURE) ×10 IMPLANT
SYR 50ML LL SCALE MARK (SYRINGE) ×5 IMPLANT
TOWEL OR 17X24 6PK STRL BLUE (TOWEL DISPOSABLE) ×10 IMPLANT
TRAY FOLEY CATH SILVER 14FR (SET/KITS/TRAYS/PACK) ×5 IMPLANT
WARMER LAPAROSCOPE (MISCELLANEOUS) ×5 IMPLANT
WATER STERILE IRR 1000ML POUR (IV SOLUTION) ×5 IMPLANT

## 2014-08-20 NOTE — Transfer of Care (Signed)
Immediate Anesthesia Transfer of Care Note  Patient: Courtney Mcintyre  Procedure(s) Performed: Procedure(s): LAPAROSCOPIC ASSISTED VAGINAL HYSTERECTOMY WITH UTERINE MORCELLATION (N/A) LAPAROSCOPIC BILATERAL SALPINGECTOMY (Bilateral) CYSTOSCOPY (N/A) LAPAROSCOPIC LYSIS OF ADHESIONS  Patient Location: PACU  Anesthesia Type:General  Level of Consciousness: awake, alert  and oriented  Airway & Oxygen Therapy: Patient Spontanous Breathing and Patient connected to face mask oxygen  Post-op Assessment: Report given to RN and Post -op Vital signs reviewed and stable  Post vital signs: Reviewed and stable  Last Vitals:  Filed Vitals:   08/20/14 0810  BP: 134/74  Pulse: 68  Temp: 36.9 C  Resp: 18    Complications: No apparent anesthesia complications

## 2014-08-20 NOTE — Anesthesia Preprocedure Evaluation (Signed)
Anesthesia Evaluation  Patient identified by MRN, date of birth, ID band Patient awake    Reviewed: Allergy & Precautions, H&P , Patient's Chart, lab work & pertinent test results, reviewed documented beta blocker date and time   History of Anesthesia Complications (+) PONV and history of anesthetic complications  Airway Mallampati: II  TM Distance: >3 FB Neck ROM: full    Dental no notable dental hx. (+) Teeth Intact   Pulmonary neg pulmonary ROS,    Pulmonary exam normal       Cardiovascular negative cardio ROS Normal cardiovascular exam    Neuro/Psych negative neurological ROS  negative psych ROS   GI/Hepatic negative GI ROS, Neg liver ROS,   Endo/Other  negative endocrine ROS  Renal/GU negative Renal ROS     Musculoskeletal   Abdominal (+) + obese,   Peds  Hematology   Anesthesia Other Findings   Reproductive/Obstetrics negative OB ROS                             Anesthesia Physical Anesthesia Plan  ASA: II  Anesthesia Plan: General   Post-op Pain Management:    Induction: Intravenous  Airway Management Planned: Oral ETT  Additional Equipment:   Intra-op Plan:   Post-operative Plan: Extubation in OR  Informed Consent: I have reviewed the patients History and Physical, chart, labs and discussed the procedure including the risks, benefits and alternatives for the proposed anesthesia with the patient or authorized representative who has indicated his/her understanding and acceptance.   Dental Advisory Given  Plan Discussed with: CRNA and Surgeon  Anesthesia Plan Comments:         Anesthesia Quick Evaluation

## 2014-08-20 NOTE — Anesthesia Postprocedure Evaluation (Signed)
  Anesthesia Post-op Note  Patient: Courtney Mcintyre  Procedure(s) Performed: Procedure(s): LAPAROSCOPIC ASSISTED VAGINAL HYSTERECTOMY WITH UTERINE MORCELLATION (N/A) LAPAROSCOPIC BILATERAL SALPINGECTOMY (Bilateral) CYSTOSCOPY (N/A) LAPAROSCOPIC LYSIS OF ADHESIONS Patient is awake and responsive. Pain and nausea are reasonably well controlled. Vital signs are stable and clinically acceptable. Oxygen saturation is clinically acceptable. There are no apparent anesthetic complications at this time. Patient is ready for discharge.

## 2014-08-20 NOTE — Anesthesia Procedure Notes (Signed)
Procedure Name: Intubation Date/Time: 08/20/2014 9:11 AM Performed by: Yolonda Kida Pre-anesthesia Checklist: Patient identified, Timeout performed, Emergency Drugs available, Suction available and Patient being monitored Patient Re-evaluated:Patient Re-evaluated prior to inductionOxygen Delivery Method: Circle system utilized Preoxygenation: Pre-oxygenation with 100% oxygen Intubation Type: IV induction Ventilation: Oral airway inserted - appropriate to patient size Laryngoscope Size: Mac and 3 Grade View: Grade I Tube type: Oral Tube size: 7.0 mm Number of attempts: 1 Airway Equipment and Method: Stylet Placement Confirmation: ETT inserted through vocal cords under direct vision,  breath sounds checked- equal and bilateral and positive ETCO2 Secured at: 21 cm Tube secured with: Tape Dental Injury: Teeth and Oropharynx as per pre-operative assessment

## 2014-08-20 NOTE — Op Note (Addendum)
OPERATIVE NOTE  Courtney Mcintyre  DOB:    1970-06-20  MRN:    [id number]  CSN:    [id number]  Date of Surgery:  08/20/2014  Preoperative Diagnosis:  Menorrhagia  Pelvic pain  History of anemia  Obesity  History of endometriosis  History of pelvic adhesions  Postoperative Diagnosis:  Same  Probable adenomyosis  Fibrous lesions on the bowel  Procedure:  Laparoscopy Assisted Vaginal Hysterectomy  Laparoscopic left salpingectomy  Laparoscopic lysis of adhesions  Right salpingectomy  Uterine morcellation  Cystoscopy  Surgeon:  Leonard Schwartz, M.D.  Assistant:  Dierdre Forth, M.D.  Anesthetic:  General  Disposition:  The patient presents with the above-mentioned diagnosis. She has had two prior cesareans. She has had a prior laparoscopy where endometriosis was diagnosed. She was noted to have adhesions on her uterus. She understands the indications for her surgical procedure.  She also understands the alternative treatment options. She accepts the risk of, but not limited to, anesthetic complications, bleeding, infections, and possible damage to the surrounding organs.  Findings:  The patient had a 14 weeks size globular uterus consistent with adenomyosis.  The uterus weighed 455 g.  The right fallopian tube and the right ovary were adhered to the right posterior uterus.  The left fallopian tube was adhered to the left ovary.  There were adhesions between the bowel and the left ovary.  There are adhesions between the omentum, bowel, and the fundus of the uterus.  Endometriosis was present on the posterior surface of the uterus.  The anterior cul-de-sac contained adhesions from the patient's prior 2 cesarean deliveries.  The posterior cul-de-sac was clear except for the adhesions between the right ovary, right fallopian tube, and the right posterior uterus.  The bowel contains several fibrous appearing lesions that measured less than 0.3 cm in size. We  were unable to visualize the appendix. The remainder of the upper abdomen and pelvis appear normal. During the cystoscopy procedure, blue dye was noted to pass through both ureteral orifices.  There was no evidence of damage to the bladder mucosa.  No pathology was identified.  Procedure:  The patient was taken to the operating room where a general anesthetic was given. The patient's  abdomen was prepped with ChloraPrep. The perineum and vagina were prepped with multiple layers of Betadine. A Foley catheter was placed in the bladder. An examination under anesthesia was performed. A Hulka tenaculum was placed inside the uterus. The patient was sterilely draped. The subumbilical area was injected with half percent Marcaine with epinephrine. An incision was made and carried sharply through the subcutaneous tissue, the fashion, and the anterior peritoneum.  A pursestring suture was placed in the fascia.  A Hassan cannula was sutured into place.  A pneumoperitoneum was obtained.  The operative laparoscope was inserted.  The pelvis was visualized with findings as mentioned above.  The adhesions between the bowel, the omentum, and the anterior uterus were cauterized and cut.  Pictures were taken.The right round ligament was identified and cauterized.  The right round ligament was cut.  The bladder flap was developed starting from the right portion of the uterus.  Because the right fallopian tube and the right ovary were so densely adhered to the posterior uterus, we elected to perform that portion of the procedure once we start the vaginal hysterectomy. The left round ligament was identified and cauterized.  The left round ligament and the left-year-old ovarian ligament were cauterized and cut.  The remainder of the labrum  that was developed.  We felt that we were ready to begin the vaginal portion of the procedure. The patient was placed in a more lithotomy position. A weighted speculum was placed in the posterior  vagina. The cervix was injected with half percent Marcaine with epinephrine. A circumferential incision was made around the cervix. The vaginal mucosa was advanced anteriorly and posteriorly. The anterior cul-de-sac and then the posterior cul-de-sac were sharply entered. Alternating from right to left the uterosacral ligaments, paracervical tissues were clamped and cut.  The right fallopian tube and the right ovary were then bluntly and sharply removed from the posterior uterus.  Moderate bleeding was encountered.  Hemostasis was achieved using figure-of-eight sutures.  An endometrioma was noted in the right ovary.  Alternating from right to left, the parametrial tissues, and uterine arteries were clamped, cut, sutured, and tied securely. Attempts were made to invert the uterus to the posterior cul-de-sac.  This was not successful.  We then morcellated the uterus beginning with the posterior aspect to the uterus could be removed through the posterior cul-de-sac.  The remainder of the upper pedicles were clamped and cut..  The uterus was removed from the operative field. Hemostasis was achieved using figure of 8 sutures. The sutures attached to the uterosacral ligaments were brought out through the vaginal angles and then tied securely. A McCall culdoplasty suture was placed in the posterior cul-de-sac incorporating the uterosacral ligaments bilaterally and the posterior peritoneum. A final check was made for hemostasis and again hemostasis was confirmed. Exparel was injected into the suture line.The vaginal cuff was closed using figure-of-eight sutures incorporating the anterior vaginal mucosa, the anterior peritoneum, posterior peritoneum, and the posterior vaginal mucosa. The McCall culdoplasty suture was tied securely and the apex of the vagina was noted to elevate into the midpelvis. The patient was given methylene blue.  The Foley catheter was removed.  The urethra was prepped with Betadine.  The cystoscope was  inserted into the bladder.  Blue dye was noted to pass through both ureteral orifices.  The cystoscope was removed and the urethra was again prepped with Betadine.  A Foley catheter was inserted into the bladder. The operator then changed gown and gloves. The pneumoperitoneum was reestablished. The pelvis was inspected and hemostasis was adequate. The pelvis was irrigated. The left mesosalpinx was cauterized and cut.  The left fallopian tube was removed from the operative field.  The pelvis was irrigated again.  Expiratory was injected along the suture lines.  Hemostasis was adequate throughout. We felt that we are ready to end the procedure. The 5 mm trochars were removed under direct visualization. The pneumoperitoneum was allowed to escape. The subumbilical trocar was removed. The subumbilical incision was closed using a deep suture of 0 Vicryl followed by skin closures of 3-0 Monocryl. The patient tolerated her procedure well. She was awakened from her anesthetic without difficulty and then transported to the recovery room in stable condition. Sponge, needle, and instrument counts were correct on 2 occasions. The estimated blood loss was estimated by the anesthesia team to be 1,300 cc's. 0 Vicryl is the suture material used throughout the procedure. The uterus and both fallopian tubes were sent to pathology.   Leonard Schwartz, M.D.

## 2014-08-20 NOTE — Progress Notes (Addendum)
Dr. Arby Barrette approved dose of phenergan now with one extra dose if needed.

## 2014-08-20 NOTE — H&P (Signed)
The patient was interviewed and examined today.  The previously documented history and physical examination was reviewed. There are no changes. The operative procedure was reviewed. The risks and benefits were outlined again. The specific risks include, but are not limited to, anesthetic complications, bleeding, infections, and possible damage to the surrounding organs. The patient's questions were answered.  We are ready to proceed as outlined. The likelihood of the patient achieving the goals of this procedure is very likely.   BP 134/74 mmHg  Pulse 68  Temp(Src) 98.4 F (36.9 C) (Oral)  Resp 18  SpO2 99%  LMP   Results for orders placed or performed during the hospital encounter of 08/20/14 (from the past 24 hour(s))  Pregnancy, urine     Status: None   Collection Time: 08/20/14  7:20 AM  Result Value Ref Range   Preg Test, Ur NEGATIVE NEGATIVE     Leonard Schwartz, M.D.

## 2014-08-20 NOTE — Progress Notes (Signed)
Subjective:  Patient reports nausea and tolerating PO.    Objective: I have reviewed patient's vital signs.  General: alert and no distress Resp: clear to auscultation bilaterally Cardio: regular rate and rhythm, S1, S2 normal, no murmur, click, rub or gallop GI: soft, non-tender; bowel sounds normal; no masses,  no organomegaly Extremities: extremities normal, atraumatic, no cyanosis or edema  CBC    Component Value Date/Time   WBC 27.0* 08/20/2014 1427   WBC 13.3* 07/31/2014 1359   RBC 4.32 08/20/2014 1427   RBC 4.92 07/31/2014 1359   HGB 11.1* 08/20/2014 1427   HGB 12.0 07/31/2014 1359   HCT 34.0* 08/20/2014 1427   HCT 38.0 07/31/2014 1359   PLT 341 08/20/2014 1427   PLT 353 07/31/2014 1359   MCV 78.7 08/20/2014 1427   MCV 77.4* 07/31/2014 1359   MCH 25.7* 08/20/2014 1427   MCH 24.4* 07/31/2014 1359   MCHC 32.6 08/20/2014 1427   MCHC 31.5 07/31/2014 1359   RDW 17.0* 08/20/2014 1427   RDW 24.0* 07/31/2014 1359   LYMPHSABS 2.8 07/31/2014 1359   MONOABS 0.8 07/31/2014 1359   EOSABS 0.4 07/31/2014 1359   BASOSABS 0.1 07/31/2014 1359    Assessment/Plan: Doing well status post laparoscopy assisted vaginal hysterectomy. Advance diet. Plan for discharge in the morning. Hemoglobin appears to be stable.  Vital signs are stable.      Janine Limbo 08/20/2014, 10:51 PM

## 2014-08-21 DIAGNOSIS — N920 Excessive and frequent menstruation with regular cycle: Secondary | ICD-10-CM | POA: Diagnosis not present

## 2014-08-21 LAB — CBC
HCT: 27.8 % — ABNORMAL LOW (ref 36.0–46.0)
Hemoglobin: 8.9 g/dL — ABNORMAL LOW (ref 12.0–15.0)
MCH: 25.4 pg — AB (ref 26.0–34.0)
MCHC: 32 g/dL (ref 30.0–36.0)
MCV: 79.4 fL (ref 78.0–100.0)
Platelets: 283 10*3/uL (ref 150–400)
RBC: 3.5 MIL/uL — AB (ref 3.87–5.11)
RDW: 16.9 % — ABNORMAL HIGH (ref 11.5–15.5)
WBC: 15.3 10*3/uL — AB (ref 4.0–10.5)

## 2014-08-21 LAB — CBC WITH DIFFERENTIAL/PLATELET
Basophils Absolute: 0.1 10*3/uL (ref 0.0–0.1)
Basophils Relative: 0 % (ref 0–1)
Eosinophils Absolute: 0.2 10*3/uL (ref 0.0–0.7)
Eosinophils Relative: 1 % (ref 0–5)
HEMATOCRIT: 29 % — AB (ref 36.0–46.0)
HEMOGLOBIN: 9 g/dL — AB (ref 12.0–15.0)
LYMPHS ABS: 3.5 10*3/uL (ref 0.7–4.0)
Lymphocytes Relative: 24 % (ref 12–46)
MCH: 25.1 pg — AB (ref 26.0–34.0)
MCHC: 31 g/dL (ref 30.0–36.0)
MCV: 81 fL (ref 78.0–100.0)
Monocytes Absolute: 1.2 10*3/uL — ABNORMAL HIGH (ref 0.1–1.0)
Monocytes Relative: 8 % (ref 3–12)
Neutro Abs: 9.7 10*3/uL — ABNORMAL HIGH (ref 1.7–7.7)
Neutrophils Relative %: 67 % (ref 43–77)
PLATELETS: 274 10*3/uL (ref 150–400)
RBC: 3.58 MIL/uL — ABNORMAL LOW (ref 3.87–5.11)
RDW: 16.9 % — ABNORMAL HIGH (ref 11.5–15.5)
WBC: 14.6 10*3/uL — AB (ref 4.0–10.5)

## 2014-08-21 LAB — CREATININE, SERUM
Creatinine, Ser: 0.7 mg/dL (ref 0.44–1.00)
GFR calc non Af Amer: >60 mL/min (ref >60)

## 2014-08-21 MED ORDER — PROMETHAZINE HCL 25 MG PO TABS
12.5000 mg | ORAL_TABLET | Freq: Four times a day (QID) | ORAL | Status: DC | PRN
  Administered 2014-08-21: 12.5 mg via ORAL
  Filled 2014-08-21: qty 1

## 2014-08-21 MED ORDER — SUGAMMADEX SODIUM 500 MG/5ML IV SOLN
INTRAVENOUS | Status: AC
  Filled 2014-08-21: qty 5

## 2014-08-21 MED ORDER — PROMETHAZINE HCL 12.5 MG PO TABS: 12.5000 mg | ORAL_TABLET | Freq: Four times a day (QID) | ORAL | Status: DC | PRN

## 2014-08-21 MED ORDER — OXYCODONE-ACETAMINOPHEN 5-325 MG PO TABS: 1.0000 | ORAL_TABLET | ORAL | Status: DC | PRN

## 2014-08-21 NOTE — Discharge Summary (Signed)
Physician Discharge Summary  Patient ID: Courtney Mcintyre MRN: [id number] DOB/AGE: [birth year] 44 y.o.  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 She has a history of  Menorrhagia, Dysmenorrhea, Anemia. Please see her documented history and physical exam. The patient has had 2 prior cesarean deliveries.  Her operative notes describe pelvic adhesions.  Hospital course:  On the day of admission, the patient underwent the following Procedure(s):  LAPAROSCOPIC ASSISTED VAGINAL HYSTERECTOMY WITH UTERINE MORCELLATION LAPAROSCOPIC BILATERAL SALPINGECTOMY CYSTOSCOPY LAPAROSCOPIC LYSIS OF ADHESIONS.   Operative findings included a 455 g globular uterus consistent with adenomyosis.  She was found to have in between doses on the posterior uterus.  She was noted to have pelvic adhesions.  Uterine morcellation was required. . Her postoperative course was complicated by a single temperature of 100.9.  This was felt to be due to atelectasis.  Her temperature returned to normal without treatment.  The patient was noted to have anemia.  She initially had nausea.  She has a history of the same after surgery.  Her nausea was controlled with Phenergan.  The patient was able to ambulate without significant  dizziness.  Her hemoglobin was noted to be stable postoperatively. She quickly tolerated a regular diet. Her postoperative pain was controlled with oral medications. She was given the option to remain in the hospital for observation overnight because of her elevated temperature.  Declined and requested to be discharged home.  Labs:  Results for orders placed or performed during the hospital encounter of 08/20/14 (from the past 24 hour(s))  CBC     Status: Abnormal   Collection Time: 08/21/14  5:17 AM  Result Value Ref Range   WBC 15.3 (H) 4.0 - 10.5 K/uL   RBC 3.50 (L) 3.87 - 5.11 MIL/uL   Hemoglobin 8.9 (L) 12.0 - 15.0 g/dL   HCT 46.5 (L) 03.5 - 46.5 %   MCV 79.4 78.0 - 100.0 fL   MCH 25.4 (L) 26.0 - 34.0 pg   MCHC 32.0 30.0 - 36.0 g/dL   RDW 68.1 (H) 27.5 - 17.0 %   Platelets 283 150 - 400 K/uL  Creatinine, serum     Status: None   Collection Time: 08/21/14  5:17 AM  Result Value Ref Range   Creatinine, Ser 0.70 0.44 - 1.00 mg/dL   GFR calc non Af Amer >60 >60 mL/min   GFR calc Af Amer >60 >60 mL/min  CBC with Differential/Platelet     Status: Abnormal   Collection Time: 08/21/14  6:12 PM  Result Value Ref Range   WBC 14.6 (H) 4.0 - 10.5 K/uL   RBC 3.58 (L) 3.87 - 5.11 MIL/uL   Hemoglobin 9.0 (L) 12.0 - 15.0 g/dL   HCT 01.7 (L) 49.4 - 49.6 %   MCV 81.0 78.0 - 100.0 fL   MCH 25.1 (L) 26.0 - 34.0 pg   MCHC 31.0 30.0 - 36.0 g/dL   RDW 75.9 (H) 16.3 -  15.5 %   Platelets 274 150 - 400 K/uL   Neutrophils Relative % 67 43 - 77 %   Neutro Abs 9.7 (H) 1.7 - 7.7 K/uL   Lymphocytes Relative 24 12 - 46 %   Lymphs Abs 3.5 0.7 - 4.0 K/uL   Monocytes Relative 8 3 - 12 %   Monocytes Absolute 1.2 (H) 0.1 - 1.0 K/uL   Eosinophils Relative 1 0 - 5 %   Eosinophils Absolute 0.2 0.0 - 0.7 K/uL   Basophils Relative 0 0 - 1 %   Basophils Absolute 0.1 0.0 - 0.1 K/uL     HEMOGLOBIN  Date Value Ref Range Status  08/21/2014 9.0* 12.0 - 15.0 g/dL Final   HGB  Date Value Ref Range Status   07/31/2014 12.0 11.6 - 15.9 g/dL Final   HCT  Date Value Ref Range Status  08/21/2014 29.0* 36.0 - 46.0 % Final  07/31/2014 38.0 34.8 - 46.6 % Final    Consults: None  Final pathology report: Pending at the time of discharge  Disposition:  The patient will be discharged to home. She has been given a copy of the discharge instructions as prepared by the [facility] for patients who have undergone the Procedure(s):  LAPAROSCOPIC ASSISTED VAGINAL HYSTERECTOMY WITH UTERINE MORCELLATION LAPAROSCOPIC BILATERAL SALPINGECTOMY CYSTOSCOPY LAPAROSCOPIC LYSIS OF ADHESIONS.  The patient was specifically told to take her temperature frequently and to call for temperature greater than 100.4 or for any problems.      Medication List    STOP taking these medications        JUNEL FE 1/20 1-20 MG-MCG tablet  Generic drug:  norethindrone-ethinyl estradiol     medroxyPROGESTERone 10 MG tablet  Commonly known as:  PROVERA      TAKE these medications        cetirizine 10 MG tablet  Commonly known as:  ZYRTEC  Take 10 mg by mouth at bedtime.     ferrous sulfate 325 (65 FE) MG tablet  Take 325 mg by mouth daily with breakfast.     fluticasone 50 MCG/ACT nasal spray  Commonly known as:  FLONASE  Place 1 spray into both nostrils at bedtime as needed for allergies.     ibuprofen 800 MG tablet  Commonly known as:  ADVIL,MOTRIN  Take 800 mg by mouth 3 (three) times daily.     montelukast 10 MG tablet  Commonly known as:  SINGULAIR  Take 10 mg by mouth at bedtime.     oxyCODONE-acetaminophen 5-325 MG per tablet  Commonly known as:  ROXICET  Take 1 tablet by mouth every 4 (four) hours as needed for severe pain.     promethazine 12.5 MG tablet  Commonly known as:  PHENERGAN  Take 1 tablet (12.5 mg total) by mouth every 6 (six) hours as needed for nausea or vomiting.     ranitidine 150 MG tablet  Commonly known as:  ZANTAC  Take 150 mg by mouth at bedtime as  needed.     vitamin B-12 100 MCG tablet  Commonly known as:  CYANOCOBALAMIN  Take 100 mcg by mouth daily.           Follow-up Information    Follow up with Janine Limbo, MD In 6 weeks.   Specialty:  Obstetrics and Gynecology   Contact information:   [address] [place] [address] [phone]       Signed: Janine Limbo 08/21/2014, 10:39 PM

## 2014-08-21 NOTE — Anesthesia Postprocedure Evaluation (Signed)
Anesthesia Post Note  Patient: Courtney Mcintyre  Procedure(s) Performed: Procedure(s) (LRB): LAPAROSCOPIC ASSISTED VAGINAL HYSTERECTOMY WITH UTERINE MORCELLATION (N/A) LAPAROSCOPIC BILATERAL SALPINGECTOMY (Bilateral) CYSTOSCOPY (N/A) LAPAROSCOPIC LYSIS OF ADHESIONS  Anesthesia type: General  Patient location: [facility]  Post pain: Pain level controlled  Post assessment: Post-op Vital signs reviewed  Last Vitals:  Filed Vitals:   08/21/14 0619  BP:   Pulse:   Temp: 37.1 C  Resp:     Post vital signs: Reviewed  Level of consciousness: sedated  Complications: No apparent anesthesia complications

## 2014-08-21 NOTE — Discharge Instructions (Signed)

## 2014-08-21 NOTE — Progress Notes (Cosign Needed)
Courtney Mcintyre is a44 y.o.  [id number]  Post Op Date # 1:  LAVH/BS/Cystocopy/LOA/Uterine Morcellation Subjective: Patient is Doing well postoperatively. However, having nausea and lightheadedness with ambulation this morning. Patient has Pain is controlled with current analgesics. Medications being used: narcotic analgesics including Percocet..Tolerating ice chips and clear liquids  Ambulating in the halls without difficulty last night but this a.m. has had nausea and lightheadedness.  Hasn't voided since Foley removed.   Objective: Vital signs in last 24 hours: Temp:  [98.4 F (36.9 C)-100.9 F (38.3 C)] 98.8 F (37.1 C) (06/10 0619) Pulse Rate:  [68-94] 76 (06/10 0117) Resp:  [12-20] 20 (06/10 0117) BP: (112-134)/(46-74) 129/46 mmHg (06/10 0117) SpO2:  [97 %-100 %] 100 % (06/10 0545) Weight:  [220 lb (99.791 kg)] 220 lb (99.791 kg) (06/09 1743)  Intake/Output from previous day: 06/09 0701 - 06/10 0700 In: 9828.1 [P.O.:880; I.V.:8898.1] Out: 4075 [Urine:2775] Intake/Output this shift:    Recent Labs Lab 08/20/14 1427 08/21/14 0517  WBC 27.0* 15.3*  HGB 11.1* 8.9*  HCT 34.0* 27.8*  PLT 341 283     Recent Labs Lab 08/21/14 0517  CREATININE 0.70    EXAM: General: cooperative and groggy (just received Phenergan) Resp: clear to auscultation bilaterally Cardio: regular rate and rhythm, S1, S2 normal, no murmur, click, rub or gallop GI: Bowel sounds present;  umbilical dressing with dried blood stain on upper portion;  lower incision withiout evidence of infection and with good would edge approximation. Extremities: Homans sign is negative, no sign of DVT and SCD hose in place and functioning. Vaginal Bleeding: small dry stain on perineal pad.   Assessment: s/p Procedure(s): LAPAROSCOPIC ASSISTED VAGINAL HYSTERECTOMY WITH UTERINE MORCELLATION LAPAROSCOPIC BILATERAL SALPINGECTOMY CYSTOSCOPY LAPAROSCOPIC LYSIS OF ADHESIONS: stable and anemia  Plan: Advance  diet Encourage ambulation Routine care per Dr. Jenness Corner, PA-C 08/21/2014 7:53 AM

## 2014-08-21 NOTE — Progress Notes (Signed)
PROGRESS NOTE  I have reviewed the patient's vital signs, labs, and notes. I have examined the patient. I agree with the previous note from the physician assistant.  Transfusion discussed. R and B reviewed. The patient declines.  Will observe for now. Watch temperature.  Leonard Schwartz, M.D. 08/21/2014

## 2014-08-21 NOTE — Addendum Note (Signed)
Addendum  created 08/21/14 0813 by Algis Greenhouse, CRNA   Modules edited: Notes Section   Notes Section:  File: [id number]

## 2014-08-21 NOTE — Progress Notes (Signed)
Discharge instructions reviewed with patient. Prescriptions given. Patient expresses understanding and no further questions. [place] changed and patient pushed in wheelchair to discharge home with husband

## 2014-08-22 ENCOUNTER — Encounter (HOSPITAL_COMMUNITY): Payer: Self-pay | Admitting: Obstetrics and Gynecology

## 2014-11-09 ENCOUNTER — Telehealth: Payer: Self-pay | Admitting: Hematology and Oncology

## 2014-11-09 NOTE — Telephone Encounter (Signed)
Returned patients call as she request to reschedule her appointment °

## 2014-11-10 ENCOUNTER — Ambulatory Visit: Payer: BLUE CROSS/BLUE SHIELD | Admitting: Hematology and Oncology

## 2014-11-10 ENCOUNTER — Other Ambulatory Visit: Payer: BLUE CROSS/BLUE SHIELD

## 2014-12-22 ENCOUNTER — Telehealth: Payer: Self-pay | Admitting: Hematology and Oncology

## 2014-12-22 NOTE — Telephone Encounter (Signed)
Called pateint back as she wishes to reschedule her appointment to january

## 2014-12-24 ENCOUNTER — Other Ambulatory Visit: Payer: BLUE CROSS/BLUE SHIELD

## 2014-12-24 ENCOUNTER — Ambulatory Visit: Payer: BLUE CROSS/BLUE SHIELD | Admitting: Hematology and Oncology

## 2015-03-25 ENCOUNTER — Telehealth: Payer: Self-pay | Admitting: Hematology and Oncology

## 2015-03-25 NOTE — Telephone Encounter (Signed)
Patient called and r/s 1/13 lab/fu to 1/30 - date per patient.

## 2015-03-26 ENCOUNTER — Other Ambulatory Visit: Payer: BLUE CROSS/BLUE SHIELD

## 2015-03-26 ENCOUNTER — Ambulatory Visit: Payer: BLUE CROSS/BLUE SHIELD | Admitting: Hematology and Oncology

## 2015-04-12 ENCOUNTER — Encounter: Payer: Self-pay | Admitting: Hematology and Oncology

## 2015-04-12 ENCOUNTER — Ambulatory Visit (HOSPITAL_BASED_OUTPATIENT_CLINIC_OR_DEPARTMENT_OTHER): Payer: BLUE CROSS/BLUE SHIELD | Admitting: Hematology and Oncology

## 2015-04-12 ENCOUNTER — Other Ambulatory Visit (HOSPITAL_BASED_OUTPATIENT_CLINIC_OR_DEPARTMENT_OTHER): Payer: BLUE CROSS/BLUE SHIELD

## 2015-04-12 VITALS — BP 114/68 | HR 67 | Temp 97.9°F | Resp 18 | Ht 65.0 in | Wt 222.1 lb

## 2015-04-12 DIAGNOSIS — D649 Anemia, unspecified: Secondary | ICD-10-CM

## 2015-04-12 DIAGNOSIS — D72829 Elevated white blood cell count, unspecified: Secondary | ICD-10-CM

## 2015-04-12 LAB — CBC WITH DIFFERENTIAL/PLATELET
BASO%: 1.1 % (ref 0.0–2.0)
BASOS ABS: 0.1 10*3/uL (ref 0.0–0.1)
EOS ABS: 0.5 10*3/uL (ref 0.0–0.5)
EOS%: 4.3 % (ref 0.0–7.0)
HEMATOCRIT: 41.1 % (ref 34.8–46.6)
HEMOGLOBIN: 13.4 g/dL (ref 11.6–15.9)
LYMPH#: 2.9 10*3/uL (ref 0.9–3.3)
LYMPH%: 25.7 % (ref 14.0–49.7)
MCH: 25.4 pg (ref 25.1–34.0)
MCHC: 32.5 g/dL (ref 31.5–36.0)
MCV: 78.3 fL — AB (ref 79.5–101.0)
MONO#: 0.9 10*3/uL (ref 0.1–0.9)
MONO%: 8.2 % (ref 0.0–14.0)
NEUT%: 60.7 % (ref 38.4–76.8)
NEUTROS ABS: 6.9 10*3/uL — AB (ref 1.5–6.5)
PLATELETS: 281 10*3/uL (ref 145–400)
RBC: 5.25 10*6/uL (ref 3.70–5.45)
RDW: 17 % — AB (ref 11.2–14.5)
WBC: 11.4 10*3/uL — AB (ref 3.9–10.3)

## 2015-04-12 NOTE — Addendum Note (Signed)
Addended by: Lorri Frederick on: 04/12/2015 01:21 PM   Modules accepted: Medications

## 2015-04-12 NOTE — Progress Notes (Signed)
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 Since then she has been doing quite well. During surgery she had lost significant blood but she had recovered very well from it. She no longer feels tired. She has healed very well from it.  REVIEW OF SYSTEMS:   Constitutional: Denies fevers, chills or abnormal weight loss Eyes: Denies blurriness of vision Ears, nose, mouth, throat, and face: Denies mucositis or sore throat Respiratory: Denies cough, dyspnea or wheezes Cardiovascular: Denies palpitation, chest discomfort Gastrointestinal:  Denies nausea, heartburn or change in bowel habits Skin: Denies abnormal skin rashes Lymphatics: Denies new lymphadenopathy or easy bruising Neurological:Denies numbness, tingling or new weaknesses Behavioral/Psych: Mood is stable, no new changes  Extremities: No lower extremity edema  All other systems were reviewed with the patient and are negative.  I have reviewed the past medical history, past surgical history, social history and family history with the patient and they are unchanged from previous note.  ALLERGIES:  is allergic to codeine; compazine; zofran; and morphine and related.  MEDICATIONS:  Current Outpatient Prescriptions  Medication Sig Dispense Refill  . cetirizine (ZYRTEC) 10 MG tablet Take 10 mg by mouth at bedtime.     . ferrous sulfate 325 (65 FE) MG tablet Take 325 mg by mouth daily with breakfast.    . fluticasone (FLONASE) 50 MCG/ACT nasal spray Place 1 spray into both nostrils at bedtime as needed for allergies.     Marland Kitchen ibuprofen (ADVIL,MOTRIN)  800 MG tablet Take 800 mg by mouth 3 (three) times daily.     . montelukast (SINGULAIR) 10 MG tablet Take 10 mg by mouth at bedtime.     Marland Kitchen oxyCODONE-acetaminophen (ROXICET) 5-325 MG per tablet Take 1 tablet by mouth every 4 (four) hours as needed for severe pain. 50 tablet 0  . promethazine (PHENERGAN) 12.5 MG tablet Take 1 tablet (12.5 mg total) by mouth every 6 (six) hours as needed for nausea or vomiting. 30 tablet 1  . ranitidine (ZANTAC) 150 MG tablet Take 150 mg by mouth at bedtime as needed.     . vitamin B-12 (CYANOCOBALAMIN) 100 MCG tablet Take 100 mcg by mouth daily.     No current facility-administered medications for this visit.    PHYSICAL EXAMINATION: ECOG PERFORMANCE STATUS: 1 - Symptomatic but completely ambulatory  Filed Vitals:   04/12/15 0900  BP: 114/68  Pulse: 67  Temp: 97.9 F (36.6 C)  Resp: 18   Filed Weights   04/12/15 0900  Weight: 222 lb 1.6 oz (100.744 kg)    GENERAL:alert, no distress and comfortable SKIN: skin color, texture, turgor are normal, no rashes or significant lesions EYES: normal, Conjunctiva are pink and non-injected, sclera clear OROPHARYNX:no exudate, no erythema and lips, buccal mucosa, and tongue normal  NECK: supple, thyroid normal size, non-tender, without nodularity LYMPH:  no palpable lymphadenopathy in the cervical, axillary or inguinal LUNGS: clear to auscultation and percussion with normal breathing effort HEART: regular rate & rhythm and no murmurs and no lower extremity edema ABDOMEN:abdomen soft, non-tender and normal bowel sounds MUSCULOSKELETAL:no cyanosis of digits and no clubbing  NEURO: alert & oriented x 3 with fluent speech, no focal motor/sensory deficits EXTREMITIES: No lower  extremity edema  LABORATORY DATA:  I have reviewed the data as listed   Chemistry      Component Value Date/Time   CREATININE 0.70 08/21/2014 0517   No results found for: CALCIUM, ALKPHOS, AST, ALT, BILITOT     Lab Results    Component Value Date   WBC 11.4* 04/12/2015   HGB 13.4 04/12/2015   HCT 41.1 04/12/2015   MCV 78.3* 04/12/2015   PLT 281 04/12/2015   NEUTROABS 6.9* 04/12/2015     ASSESSMENT & PLAN:  1. Leukocytosis primarily neutrophilia: Today her white count is down to 11.4 and a neutrophil count is decreased to 6.9. I discussed with her that since her abdominal surgery her blood #7 improved significantly. So the cause of her symptoms is most likely related to her uterine adenomyosis. I do not believe there is any underlying hematological disorder. She may have a mildly elevated white cell count chronically. It may also return back to normal at some point in the future. I do not think there is any necessity to do a bone marrow biopsy on her since it all appears to be reactive.  2. Severe anemia during surgery related to blood loss: Today her hemoglobin is 13.4. She is no longer taking INR because supplements. She is still slightly microcytic and I encouraged her to be an iron containing diet.   Return to clinic as needed.  No orders of the defined types were placed in this encounter.   The patient has a good understanding of the overall plan. she agrees with it. she will call with any problems that may develop before the next visit here.   Rulon Eisenmenger, MD 04/12/2015

## 2015-04-12 NOTE — Progress Notes (Signed)
Unable to get in to exam room prior to MD.  No assessment performed.  

## 2015-04-12 NOTE — Progress Notes (Signed)
Medications and Allergies reconciled from pt list provided by MD.

## 2017-06-28 ENCOUNTER — Encounter (HOSPITAL_COMMUNITY): Payer: Self-pay | Admitting: Obstetrics and Gynecology

## 2020-10-26 ENCOUNTER — Other Ambulatory Visit (HOSPITAL_COMMUNITY): Payer: Self-pay | Admitting: Family Medicine

## 2020-12-15 ENCOUNTER — Ambulatory Visit (HOSPITAL_COMMUNITY)
Admission: RE | Admit: 2020-12-15 | Discharge: 2020-12-15 | Disposition: A | Discharge status: Home / Self Care | Payer: Self-pay | Source: Ambulatory Visit | Attending: Family Medicine | Admitting: Family Medicine

## 2020-12-15 ENCOUNTER — Other Ambulatory Visit: Payer: Self-pay

## 2020-12-15 DIAGNOSIS — E7800 Pure hypercholesterolemia, unspecified: Secondary | ICD-10-CM | POA: Insufficient documentation

## 2022-05-13 IMAGING — CT CT CARDIAC CORONARY ARTERY CALCIUM SCORE
2 series · 14 of 20 positions shown, 16 images · non-contrast
Comparison: None.

Addendum:
CLINICAL DATA: Cardiovascular Disease Risk stratification

EXAM:
Coronary Calcium Score
TECHNIQUE: A gated, non-contrast computed tomography scan of the heart was
performed using 3mm slice thickness. Axial images were analyzed on a
dedicated workstation. Calcium scoring of the coronary arteries was
performed using the Agatston method.

[Series 3: cascseq 2.0 b35f 70% · axial · 0.39mm/px · z∈[+1186,+1262]mm · 6 of 69 slices shown]
[im 8/69  vessel]
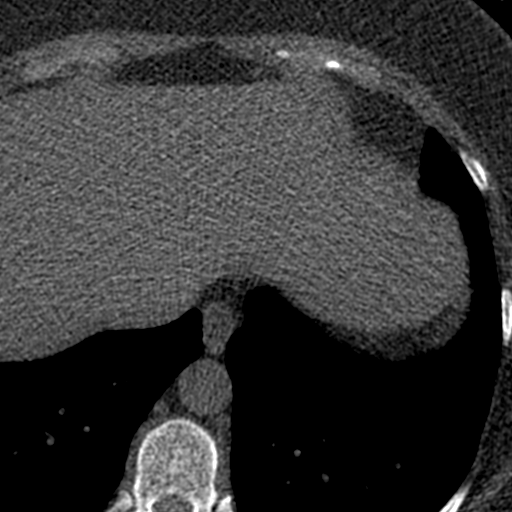
[im 16/69  vessel]
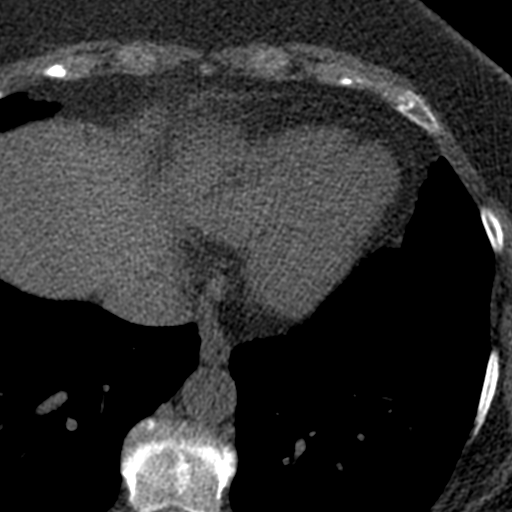
[im 23/69  vessel]
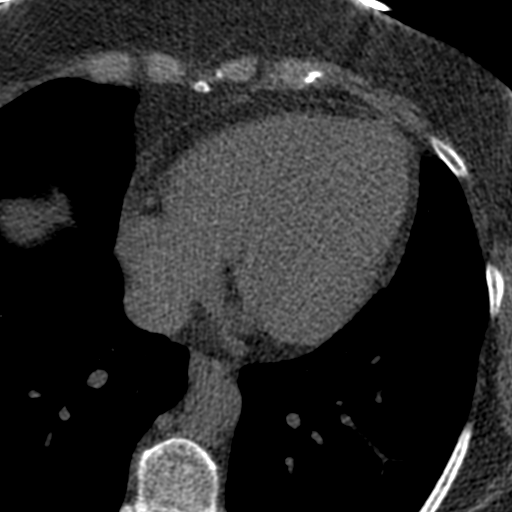
[im 31/69  vessel]
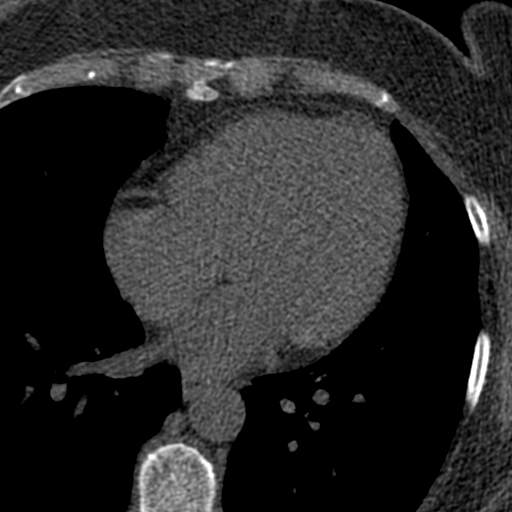
[im 38/69  vessel]
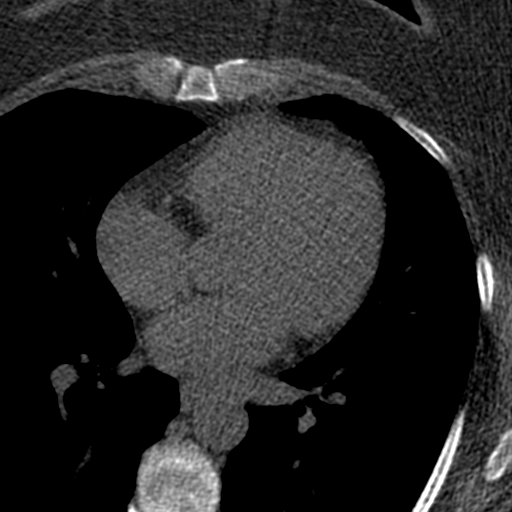
[im 46/69  vessel]
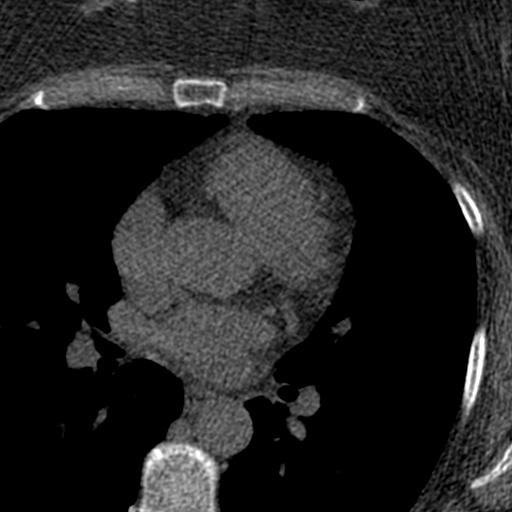

[Series 4: ax st full fov · axial · 0.55mm/px · z∈[+1186,+1292]mm · 8 of 69 slices shown, 10 images]
[im 8/69  vessel]
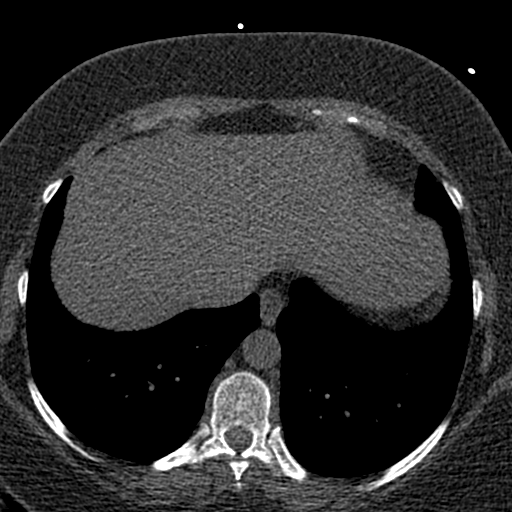
[im 8/69  lung]
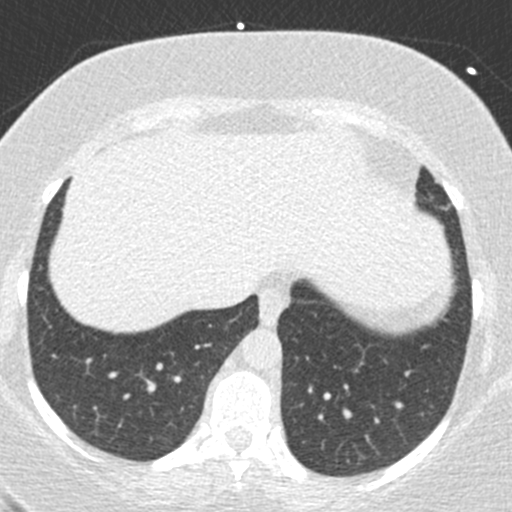
[im 16/69  vessel]
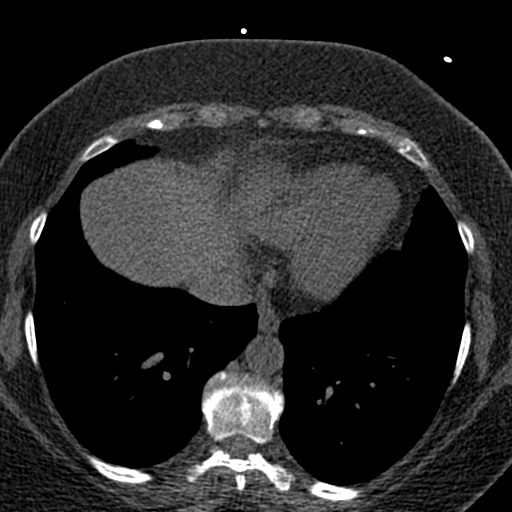
[im 23/69  vessel]
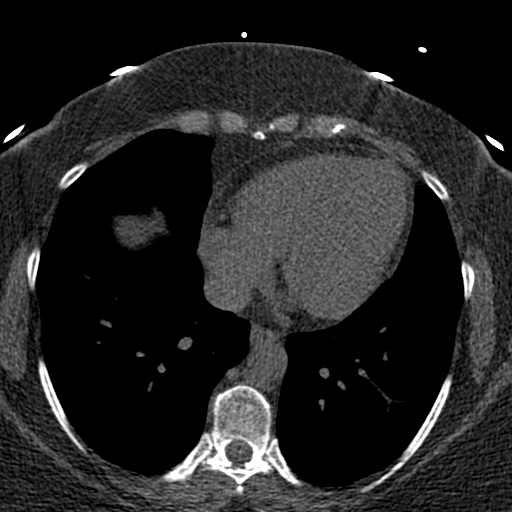
[im 31/69  vessel]
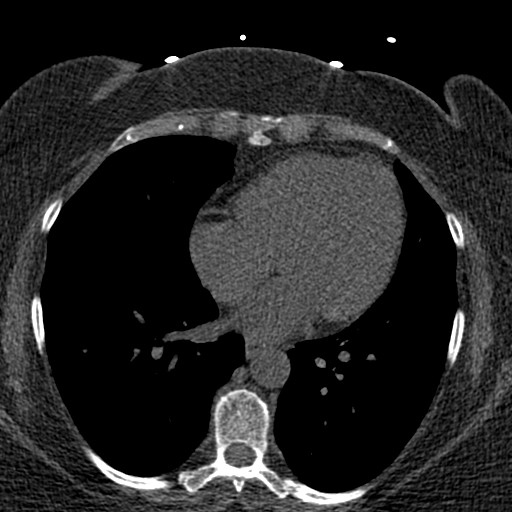
[im 38/69  vessel]
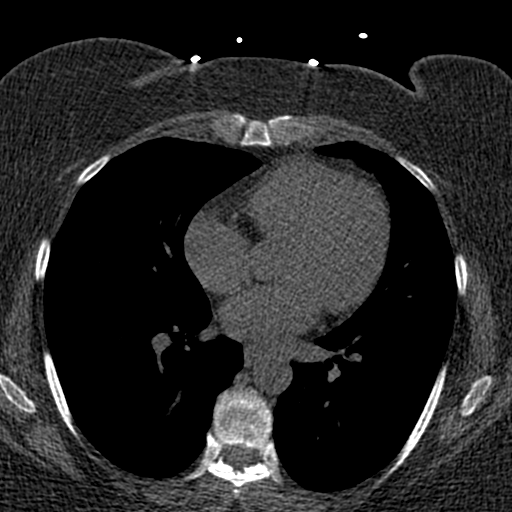
[im 38/69  lung]
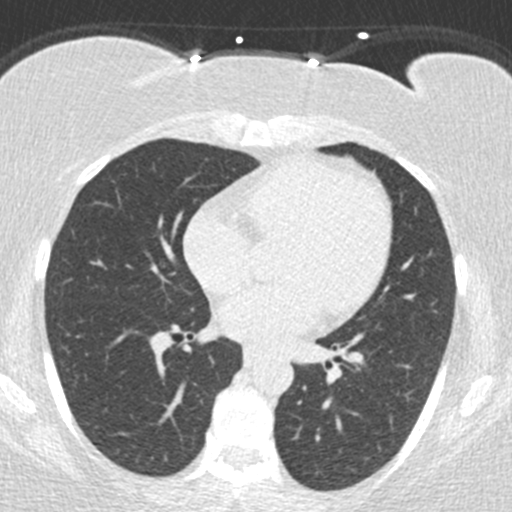
[im 46/69  vessel]
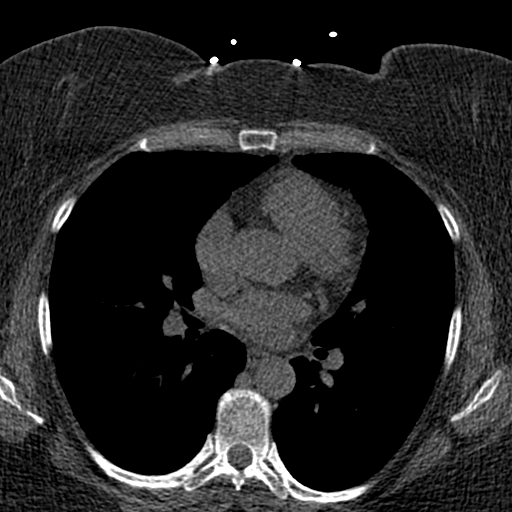
[im 53/69  vessel]
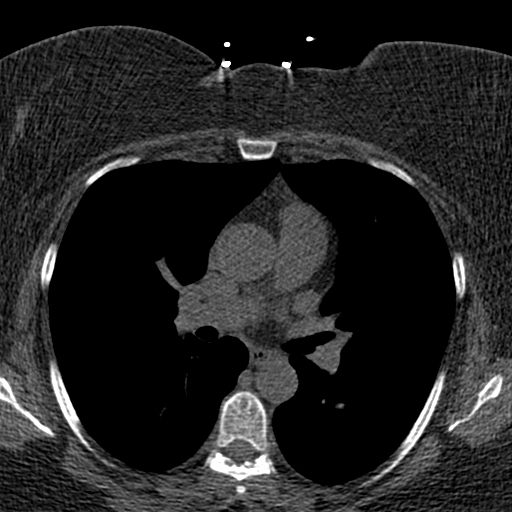
[im 61/69  vessel]
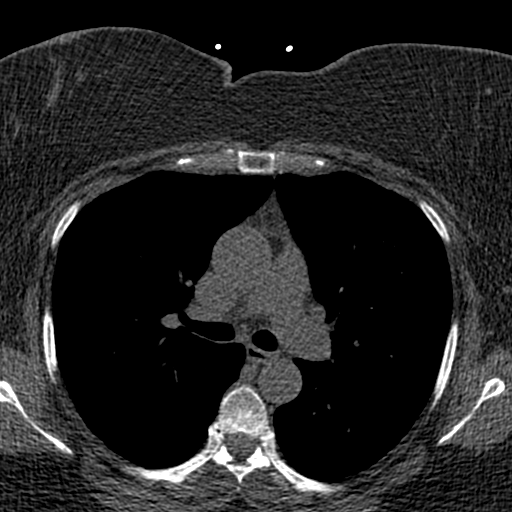

[14 of 20 positions shown; findings below may reference images not displayed]

FINDINGS: Coronary arteries: Normal origins.

Coronary Calcium Score:

Left main: 0

Left anterior descending artery: 0

Left circumflex artery: 0

Right coronary artery: 0

Total: 0

Percentile: 0

Pericardium: Normal.

Ascending Aorta: Normal caliber.

Non-cardiac: See separate report from [REDACTED].
IMPRESSION: Coronary calcium score of 0. This was 0 percentile for age-, race-,
and sex-matched controls.



If CAC=0, it is reasonable to withhold statin therapy and reassess
in 5 to 10 years, as long as higher risk conditions are absent
(diabetes mellitus, family history of premature CHD in first degree
relatives (males <55 years; females <65 years), cigarette smoking,
or LDL >=190 mg/dL).

If CAC is 1 to 99, it is reasonable to initiate statin therapy for
patients >=55 years of age.

If CAC is >=100 or >=75th percentile, it is reasonable to initiate
statin therapy at any age.

Cardiology referral should be considered for patients with CAC
scores >=400 or >=75th percentile.

*4738 AHA/ACC/AACVPR/AAPA/ABC/MUSLIJA/TOTTEN/AKUNDINGYA/Dionissis/LOCKLEAR/ALBERGA/SOUKSAVANH
Guideline on the Management of Blood Cholesterol: A Report of the
American College of Cardiology/American Heart Association Task Force
on Clinical Practice Guidelines. J Am Coll Cardiol.
8665;73(24):5035-5291.

EXAM:
OVER-READ INTERPRETATION  CT CHEST

The following report is an over-read performed by radiologist Dr.
over-read does not include interpretation of cardiac or coronary
anatomy or pathology. The coronary calcium score interpretation by
the cardiologist is attached.
FINDINGS: No mediastinal mass or adenopathy.

No pleural effusion, airspace consolidation or atelectasis. No
suspicious lung nodules identified.

No acute findings within the imaged portions of the upper abdomen.

No suspicious or acute osseous findings.
IMPRESSION: No significant noncardiac supplemental findings.

*** End of Addendum ***
FINDINGS: Coronary arteries: Normal origins.

Coronary Calcium Score:

Left main: 0

Left anterior descending artery: 0

Left circumflex artery: 0

Right coronary artery: 0

Total: 0

Percentile: 0

Pericardium: Normal.

Ascending Aorta: Normal caliber.

Non-cardiac: See separate report from [REDACTED].
IMPRESSION: Coronary calcium score of 0. This was 0 percentile for age-, race-,
and sex-matched controls.



If CAC=0, it is reasonable to withhold statin therapy and reassess
in 5 to 10 years, as long as higher risk conditions are absent
(diabetes mellitus, family history of premature CHD in first degree
relatives (males <55 years; females <65 years), cigarette smoking,
or LDL >=190 mg/dL).

If CAC is 1 to 99, it is reasonable to initiate statin therapy for
patients >=55 years of age.

If CAC is >=100 or >=75th percentile, it is reasonable to initiate
statin therapy at any age.

Cardiology referral should be considered for patients with CAC
scores >=400 or >=75th percentile.

*4738 AHA/ACC/AACVPR/AAPA/ABC/MUSLIJA/TOTTEN/AKUNDINGYA/Dionissis/LOCKLEAR/ALBERGA/SOUKSAVANH
Guideline on the Management of Blood Cholesterol: A Report of the
American College of Cardiology/American Heart Association Task Force
on Clinical Practice Guidelines. J Am Coll Cardiol.
8665;73(24):5035-5291.

## 2023-05-28 ENCOUNTER — Encounter: Payer: Self-pay | Admitting: Gastroenterology

## 2023-07-11 ENCOUNTER — Other Ambulatory Visit (HOSPITAL_COMMUNITY): Payer: Self-pay

## 2023-07-11 MED ORDER — NYSTATIN 100000 UNIT/GM EX POWD
CUTANEOUS | 3 refills | Status: AC
  Filled 2023-07-11: qty 30, 7d supply, fill #0

## 2023-08-17 ENCOUNTER — Other Ambulatory Visit: Payer: Self-pay | Admitting: Obstetrics and Gynecology

## 2023-08-17 DIAGNOSIS — Z1231 Encounter for screening mammogram for malignant neoplasm of breast: Secondary | ICD-10-CM

## 2023-08-24 ENCOUNTER — Encounter: Payer: Self-pay | Admitting: Gastroenterology

## 2023-08-24 ENCOUNTER — Ambulatory Visit (AMBULATORY_SURGERY_CENTER): Admitting: *Deleted

## 2023-08-24 VITALS — Ht 67.0 in | Wt 200.0 lb

## 2023-08-24 DIAGNOSIS — Z1211 Encounter for screening for malignant neoplasm of colon: Secondary | ICD-10-CM

## 2023-08-24 DIAGNOSIS — Z800 Family history of malignant neoplasm of digestive organs: Secondary | ICD-10-CM

## 2023-08-24 NOTE — Progress Notes (Signed)
 Pt's name and DOB verified at the beginning of the pre-visit wit 2 identifiers  Pt denies any difficulty with ambulating,sitting, laying down or rolling side to side  Pt has no issues moving head neck or swallowing  No egg or soy allergy known to patient   PONV severe per pt  Patient denies ever being intubated  No FH of Malignant Hyperthermia  Pt is not on home 02   Pt is not on blood thinners   Pt has frequent issues with constipation RN instructed pt to use Miralax per bottles instructions a week before prep days. Pt states they will  Pt is not on dialysis  Pt denise any abnormal heart rhythms   Pt denies any upcoming cardiac testing  Patient's chart reviewed by Rogena Class CNRA prior to pre-visit and patient appropriate for the [facility].  Pre-visit completed and red dot placed by patient's name on their procedure day (on provider's schedule).    Visit by phone  Pt states weight is 200 lb  IInstructions reviewed. Pt given  both [facility] main # and MD on call # prior to instructions.  Pt states understanding of instructions. Instructed pt to review instructions again prior to procedure and call main # given if has questions.. Pt states they will.   Instructed pt on where to find instructions on My Chart.

## 2023-09-07 ENCOUNTER — Encounter: Payer: Self-pay | Admitting: Gastroenterology

## 2023-09-07 ENCOUNTER — Ambulatory Visit (AMBULATORY_SURGERY_CENTER): Admitting: Gastroenterology

## 2023-09-07 VITALS — BP 129/58 | HR 76 | Temp 98.2°F | Resp 10 | Ht 67.0 in | Wt 200.0 lb

## 2023-09-07 DIAGNOSIS — K648 Other hemorrhoids: Secondary | ICD-10-CM

## 2023-09-07 DIAGNOSIS — K644 Residual hemorrhoidal skin tags: Secondary | ICD-10-CM | POA: Diagnosis not present

## 2023-09-07 DIAGNOSIS — Z1211 Encounter for screening for malignant neoplasm of colon: Secondary | ICD-10-CM | POA: Diagnosis present

## 2023-09-07 DIAGNOSIS — K5730 Diverticulosis of large intestine without perforation or abscess without bleeding: Secondary | ICD-10-CM

## 2023-09-07 DIAGNOSIS — Z800 Family history of malignant neoplasm of digestive organs: Secondary | ICD-10-CM

## 2023-09-07 DIAGNOSIS — K529 Noninfective gastroenteritis and colitis, unspecified: Secondary | ICD-10-CM

## 2023-09-07 MED ORDER — SODIUM CHLORIDE 0.9 % IV SOLN: 500.0000 mL | Freq: Once | INTRAVENOUS | Status: DC

## 2023-09-07 NOTE — Progress Notes (Signed)
To pacu, vss. Report to [email or web address] 

## 2023-09-07 NOTE — Progress Notes (Signed)
 Vitals-DT  Pt's states no medical or surgical changes since previsit or office visit.  Feels nauseated.  States that she is not feeling that  has to throw up. CRNA notified.  Patient states that she still wants to do the procedure even though she could get sick. Patient aware of the possibility that she might get very nauseated after the propofol .  She also understands that the CRNA does not have phenergan  to give her.  Doctor is aware.  Patient also declined the possibility of having the procedure without sedation.  She and her husband discussed the situation.  Husband stated that he would take full responsibilllity for her post procedure.

## 2023-09-07 NOTE — Patient Instructions (Addendum)
-   Resume previous diet. - Continue present medications. - Await pathology results. - Repeat colonoscopy in 10 years for surveillance. - No aspirin, ibuprofen , naproxen, or other non-steroidal anti-inflammatory drugs.  YOU HAD AN ENDOSCOPIC PROCEDURE TODAY AT THE [facility]:   Refer to the procedure report that was given to you for any specific questions about what was found during the examination.  If the procedure report does not answer your questions, please call your gastroenterologist to clarify.  If you requested that your care partner not be given the details of your procedure findings, then the procedure report has been included in a sealed envelope for you to review at your convenience later.  YOU SHOULD EXPECT: Some feelings of bloating in the abdomen. Passage of more gas than usual.  Walking can help get rid of the air that was put into your GI tract during the procedure and reduce the bloating. If you had a lower endoscopy (such as a colonoscopy or flexible sigmoidoscopy) you may notice spotting of blood in your stool or on the toilet paper. If you underwent a bowel prep for your procedure, you may not have a normal bowel movement for a few days.  Please Note:  You might notice some irritation and congestion in your nose or some drainage.  This is from the oxygen used during your procedure.  There is no need for concern and it should clear up in a day or so.  SYMPTOMS TO REPORT IMMEDIATELY:  Following lower endoscopy (colonoscopy or flexible sigmoidoscopy):  Excessive amounts of blood in the stool  Significant tenderness or worsening of abdominal pains  Swelling of the abdomen that is new, acute  Fever of 100F or higher  For urgent or emergent issues, a gastroenterologist can be reached at any hour by calling [phone]. Do not use MyChart messaging for urgent concerns.    DIET:  We do recommend a small meal at first, but then you may proceed to your regular  diet.  Drink plenty of fluids but you should avoid alcoholic beverages for 24 hours.  ACTIVITY:  You should plan to take it easy for the rest of today and you should NOT DRIVE or use heavy machinery until tomorrow (because of the sedation medicines used during the test).    FOLLOW UP: Our staff will call the number listed on your records the next business day following your procedure.  We will call around 7:15- 8:00 am to check on you and address any questions or concerns that you may have regarding the information given to you following your procedure. If we do not reach you, we will leave a message.     If any biopsies were taken you will be contacted by phone or by letter within the next 1-3 weeks.  Please call us  at [phone] if you have not heard about the biopsies in 3 weeks.    SIGNATURES/CONFIDENTIALITY: You and/or your care partner have signed paperwork which will be entered into your electronic medical record.  These signatures attest to the fact that that the information above on your After Visit Summary has been reviewed and is understood.  Full responsibility of the confidentiality of this discharge information lies with you and/or your care-partner.

## 2023-09-07 NOTE — Op Note (Signed)
 [facility] Patient Name: Courtney Mcintyre Procedure Date: 09/07/2023 11:53 AM MRN: [id number] Endoscopist: Gustav ALONSO Mcgee , MD, [id number] Age: 53 Referring MD:  Date of Birth: Jul 13, 1970 Gender: Female Account #: [id number] Procedure:                Colonoscopy Indications:              Screening for colon cancer: Family history of                            colorectal cancer in distant relative(s) 60 or older Medicines:                Monitored Anesthesia Care Procedure:                Pre-Anesthesia Assessment:                           - Prior to the procedure, a History and Physical                            was performed, and patient medications and                            allergies were reviewed. The patient's tolerance of                            previous anesthesia was also reviewed. The risks                            and benefits of the procedure and the sedation                            options and risks were discussed with the patient.                            All questions were answered, and informed consent                            was obtained. Prior Anticoagulants: The patient has                            taken no anticoagulant or antiplatelet agents. ASA                            Grade Assessment: II - A patient with mild systemic                            disease. After reviewing the risks and benefits,                            the patient was deemed in satisfactory condition to                            undergo the procedure.  After obtaining informed consent, the colonoscope                            was passed under direct vision. Throughout the                            procedure, the patient's blood pressure, pulse, and                            oxygen saturations were monitored continuously. The                            Olympus Scope SN: [id number] was introduced through                             the anus and advanced to the the cecum, identified                            by appendiceal orifice and ileocecal valve. The                            colonoscopy was performed without difficulty. The                            patient tolerated the procedure well. The quality                            of the bowel preparation was good. The ileocecal                            valve, appendiceal orifice, and rectum were                            photographed. Scope In: 12:01:36 PM Scope Out: 12:15:49 PM Scope Withdrawal Time: 0 hours 8 minutes 20 seconds  Total Procedure Duration: 0 hours 14 minutes 13 seconds  Findings:                 The perianal and digital rectal examinations were                            normal.                           A few patchy non-bleeding erosions were found in                            the cecum. Biopsies were taken with a cold forceps                            for histology.                           Scattered medium-mouthed and small-mouthed  diverticula were found in the sigmoid colon,                            descending colon and transverse colon.                           Non-bleeding external and internal hemorrhoids were                            found during retroflexion. The hemorrhoids were                            small.                           The exam was otherwise without abnormality.                           The terminal ileum appeared normal. Complications:            No immediate complications. Estimated Blood Loss:     Estimated blood loss was minimal. Impression:               - A few erosions in the cecum. Biopsied.                           - Diverticulosis in the sigmoid colon, in the                            descending colon and in the transverse colon.                           - Non-bleeding external and internal hemorrhoids.                           - The examination was otherwise  normal.                           - The examined portion of the ileum was normal. Recommendation:           - Resume previous diet.                           - Continue present medications.                           - Await pathology results.                           - Repeat colonoscopy in 10 years for surveillance.                           - No aspirin, ibuprofen , naproxen, or other                            non-steroidal anti-inflammatory drugs. Adaliz Dobis V. Reathel Turi, MD 09/07/2023 12:40:07 PM This report has been signed electronically.

## 2023-09-07 NOTE — Progress Notes (Signed)
 [facility] History and Physical   Primary Care Physician:  Chrystal Lamarr RAMAN, MD   Reason for Procedure:  Colorectal cancer screening  Plan:    Screening colonoscopy with possible interventions as needed     HPI: Courtney Mcintyre is a very pleasant 53 y.o. female here for screening colonoscopy. Patient has h/o nausea post procedure, prior procedures were with general Anaesthesia. She is willing to proceed with propofol  sedation  The risks and benefits as well as alternatives of endoscopic procedure(s) have been discussed and reviewed. All questions answered. The patient agrees to proceed.    Past Medical History:  Diagnosis Date   Anemia    Asthma    Bronchitis    Constipation    GERD (gastroesophageal reflux disease)    PCOS (polycystic ovarian syndrome)    PONV (postoperative nausea and vomiting)    PONV (postoperative nausea and vomiting)    Seasonal allergies     Past Surgical History:  Procedure Laterality Date   CESAREAN SECTION  2001, 2007   CHOLECYSTECTOMY     cystoscopy     CYSTOSCOPY N/A 08/20/2014   Procedure: CYSTOSCOPY;  Surgeon: Rome Rigg, MD;  Location: [facility];  Service: Gynecology;  Laterality: N/A;   DEBRIDMENT OF DECUBITUS ULCER     C-Section scar   HEMORROIDECTOMY     LAPAROSCOPIC ASSISTED VAGINAL HYSTERECTOMY N/A 08/20/2014   Procedure: LAPAROSCOPIC ASSISTED VAGINAL HYSTERECTOMY WITH UTERINE MORCELLATION;  Surgeon: Rome Rigg, MD;  Location: [facility];  Service: Gynecology;  Laterality: N/A;   LAPAROSCOPIC BILATERAL SALPINGECTOMY Bilateral 08/20/2014   Procedure: LAPAROSCOPIC BILATERAL SALPINGECTOMY;  Surgeon: Rome Rigg, MD;  Location: [facility];  Service: Gynecology;  Laterality: Bilateral;   LAPAROSCOPIC ENDOMETRIOSIS FULGURATION  03/14/2003   LAPAROSCOPIC LYSIS OF ADHESIONS  08/20/2014   Procedure: LAPAROSCOPIC LYSIS OF ADHESIONS;  Surgeon: Rome Rigg, MD;  Location: [facility];  Service: Gynecology;;   TUBAL  LIGATION     WISDOM TOOTH EXTRACTION      Prior to Admission medications   Medication Sig Start Date End Date Taking? Authorizing Provider  fluticasone  (FLONASE ) 50 MCG/ACT nasal spray Place 1 spray into both nostrils at bedtime as needed for allergies.    Yes [provider]  levocetirizine (XYZAL ALLERGY 24HR) 5 MG tablet 1 tablet in the evening Orally Once a day   Yes [provider]  montelukast  (SINGULAIR ) 10 MG tablet Take 10 mg by mouth at bedtime.  07/02/14  Yes [provider]  albuterol (VENTOLIN HFA) 108 (90 Base) MCG/ACT inhaler ProAir HFA 90 mcg/actuation aerosol inhaler 09/17/17   [provider]  nystatin  (MYCOSTATIN /NYSTOP ) powder Apply three times daily to rash. 07/11/23       Current Outpatient Medications  Medication Sig Dispense Refill   fluticasone  (FLONASE ) 50 MCG/ACT nasal spray Place 1 spray into both nostrils at bedtime as needed for allergies.      levocetirizine (XYZAL ALLERGY 24HR) 5 MG tablet 1 tablet in the evening Orally Once a day     montelukast  (SINGULAIR ) 10 MG tablet Take 10 mg by mouth at bedtime.      albuterol (VENTOLIN HFA) 108 (90 Base) MCG/ACT inhaler ProAir HFA 90 mcg/actuation aerosol inhaler     nystatin  (MYCOSTATIN /NYSTOP ) powder Apply three times daily to rash. 30 g 3   Current Facility-Administered Medications  Medication Dose Route Frequency Provider Last Rate Last Admin   0.9 %  sodium chloride  infusion  500 mL Intravenous Once Mariyanna Mucha V, MD  Allergies as of 09/07/2023 - Review Complete 09/07/2023  Allergen Reaction Noted   Codeine Nausea Only and Other (See Comments) 02/22/2012   Compazine [prochlorperazine edisylate] Nausea And Vomiting and Other (See Comments) 02/22/2012   Zofran  [ondansetron  hcl] Nausea And Vomiting and Other (See Comments) 02/22/2012   Morphine and codeine Itching and Nausea And Vomiting 08/05/2014   Other Itching and Nausea And Vomiting 08/05/2014    Family  History  Problem Relation Age of Onset   Heart disease Father    Hypercholesterolemia Father    Endometriosis Sister    Endometriosis Sister    Colon cancer Maternal Grandfather    Esophageal cancer Paternal Grandmother    Stomach cancer Paternal Grandmother    Colon polyps Neg Hx    Rectal cancer Neg Hx     Social History   Socioeconomic History   Marital status: Married    Spouse name: Not on file   Number of children: Not on file   Years of education: Not on file   Highest education level: Not on file  Occupational History   Not on file  Tobacco Use   Smoking status: Never   Smokeless tobacco: Never  Vaping Use   Vaping status: Never Used  Substance and Sexual Activity   Alcohol use: Yes    Comment: occasional   Drug use: No   Sexual activity: Yes    Partners: Male    Birth control/protection: Surgical  Other Topics Concern   Not on file  Social History Narrative   Not on file   Social Drivers of Health   Financial Resource Strain: Not on file  Food Insecurity: Not on file  Transportation Needs: Not on file  Physical Activity: Not on file  Stress: Not on file  Social Connections: Not on file  Intimate Partner Violence: Not on file    Review of Systems:  All other review of systems negative except as mentioned in the HPI.  Physical Exam: Vital signs in last 24 hours: BP (!) 118/91 (BP Location: Right Arm, Patient Position: Sitting, Cuff Size: Normal)   Pulse 88   Temp 98.2 F (36.8 C) (Temporal)   Ht 5' 7 (1.702 m)   Wt 200 lb (90.7 kg)   SpO2 96%   BMI 31.32 kg/m  General:   Alert, NAD Lungs:  Clear .   Heart:  Regular rate and rhythm Abdomen:  Soft, nontender and nondistended. Neuro/Psych:  Alert and cooperative. Normal mood and affect. A and O x 3  Reviewed labs, radiology imaging, old records and pertinent past GI work up  Patient is appropriate for planned procedure(s) and anesthesia in an ambulatory setting   K. Veena Dawanda Mapel ,  MD [phone]

## 2023-09-07 NOTE — Progress Notes (Signed)
 Called to room to assist during endoscopic procedure.  Patient ID and intended procedure confirmed with present staff. Received instructions for my participation in the procedure from the performing physician.

## 2023-09-10 ENCOUNTER — Telehealth: Payer: Self-pay | Admitting: *Deleted

## 2023-09-10 NOTE — Telephone Encounter (Signed)
 No answer on  follow up call. Left message.

## 2023-09-11 LAB — SURGICAL PATHOLOGY

## 2024-01-23 ENCOUNTER — Ambulatory Visit: Payer: Self-pay | Admitting: Gastroenterology
